# Patient Record
Sex: Female | Born: 1960 | Race: White | Hispanic: No | Smoking: Never smoker
Health system: Southern US, Community
[De-identification: ages and names within clinical notes are randomized; demographics above are authoritative.]

## PROBLEM LIST (undated history)

## (undated) DIAGNOSIS — J45909 Unspecified asthma, uncomplicated: Secondary | ICD-10-CM

## (undated) DIAGNOSIS — M1990 Unspecified osteoarthritis, unspecified site: Secondary | ICD-10-CM

## (undated) DIAGNOSIS — I10 Essential (primary) hypertension: Secondary | ICD-10-CM

## (undated) DIAGNOSIS — E785 Hyperlipidemia, unspecified: Secondary | ICD-10-CM

## (undated) DIAGNOSIS — M810 Age-related osteoporosis without current pathological fracture: Secondary | ICD-10-CM

## (undated) HISTORY — DX: Hyperlipidemia, unspecified: E78.5

## (undated) HISTORY — DX: Unspecified osteoarthritis, unspecified site: M19.90

## (undated) HISTORY — DX: Age-related osteoporosis without current pathological fracture: M81.0

## (undated) HISTORY — PX: BREAST BIOPSY: SHX20

## (undated) HISTORY — PX: BREAST EXCISIONAL BIOPSY: SUR124

## (undated) HISTORY — DX: Unspecified asthma, uncomplicated: J45.909

## (undated) HISTORY — DX: Essential (primary) hypertension: I10

---

## 1981-04-29 HISTORY — PX: KNEE SURGERY: SHX244

## 1999-04-30 HISTORY — PX: TOTAL ABDOMINAL HYSTERECTOMY: SHX209

## 2007-12-29 ENCOUNTER — Ambulatory Visit: Payer: Self-pay | Admitting: Internal Medicine

## 2008-01-22 ENCOUNTER — Ambulatory Visit: Payer: Self-pay | Admitting: Oncology

## 2008-01-28 ENCOUNTER — Ambulatory Visit: Payer: Self-pay | Admitting: Oncology

## 2008-02-28 ENCOUNTER — Ambulatory Visit: Payer: Self-pay | Admitting: Oncology

## 2008-03-29 ENCOUNTER — Ambulatory Visit: Payer: Self-pay | Admitting: Oncology

## 2008-04-29 ENCOUNTER — Ambulatory Visit: Payer: Self-pay | Admitting: Oncology

## 2008-05-30 ENCOUNTER — Ambulatory Visit: Payer: Self-pay | Admitting: Oncology

## 2008-06-27 ENCOUNTER — Ambulatory Visit: Payer: Self-pay | Admitting: Oncology

## 2008-07-28 ENCOUNTER — Ambulatory Visit: Payer: Self-pay

## 2008-08-01 ENCOUNTER — Ambulatory Visit: Payer: Self-pay | Admitting: Oncology

## 2008-08-27 ENCOUNTER — Ambulatory Visit: Payer: Self-pay

## 2008-08-27 ENCOUNTER — Ambulatory Visit: Payer: Self-pay | Admitting: Oncology

## 2008-09-27 ENCOUNTER — Ambulatory Visit: Payer: Self-pay | Admitting: Oncology

## 2008-10-27 ENCOUNTER — Ambulatory Visit: Payer: Self-pay | Admitting: Oncology

## 2008-12-12 ENCOUNTER — Ambulatory Visit: Payer: Self-pay | Admitting: Oncology

## 2008-12-28 ENCOUNTER — Ambulatory Visit: Payer: Self-pay | Admitting: Oncology

## 2009-01-27 ENCOUNTER — Ambulatory Visit: Payer: Self-pay | Admitting: Oncology

## 2009-02-20 ENCOUNTER — Ambulatory Visit: Payer: Self-pay

## 2009-02-27 ENCOUNTER — Ambulatory Visit: Payer: Self-pay | Admitting: Oncology

## 2009-03-29 ENCOUNTER — Ambulatory Visit: Payer: Self-pay | Admitting: Oncology

## 2009-04-29 HISTORY — PX: MENISCECTOMY: SHX123

## 2009-05-15 ENCOUNTER — Ambulatory Visit: Payer: Self-pay | Admitting: Internal Medicine

## 2009-05-30 ENCOUNTER — Ambulatory Visit: Payer: Self-pay | Admitting: Internal Medicine

## 2009-06-27 ENCOUNTER — Ambulatory Visit: Payer: Self-pay | Admitting: Internal Medicine

## 2009-07-28 ENCOUNTER — Ambulatory Visit: Payer: Self-pay | Admitting: Internal Medicine

## 2009-08-27 ENCOUNTER — Ambulatory Visit: Payer: Self-pay | Admitting: Internal Medicine

## 2009-09-27 ENCOUNTER — Ambulatory Visit: Payer: Self-pay | Admitting: Internal Medicine

## 2009-10-27 ENCOUNTER — Ambulatory Visit: Payer: Self-pay | Admitting: Oncology

## 2009-11-03 ENCOUNTER — Ambulatory Visit: Payer: Self-pay | Admitting: Oncology

## 2009-11-27 ENCOUNTER — Ambulatory Visit: Payer: Self-pay | Admitting: Oncology

## 2009-11-27 ENCOUNTER — Ambulatory Visit: Payer: Self-pay | Admitting: Internal Medicine

## 2009-12-28 ENCOUNTER — Ambulatory Visit: Payer: Self-pay | Admitting: Oncology

## 2010-01-27 ENCOUNTER — Ambulatory Visit: Payer: Self-pay | Admitting: Internal Medicine

## 2010-01-27 ENCOUNTER — Ambulatory Visit: Payer: Self-pay | Admitting: Oncology

## 2010-02-27 ENCOUNTER — Ambulatory Visit: Payer: Self-pay | Admitting: Oncology

## 2010-02-27 ENCOUNTER — Ambulatory Visit: Payer: Self-pay | Admitting: Internal Medicine

## 2010-03-29 ENCOUNTER — Ambulatory Visit: Payer: Self-pay | Admitting: Oncology

## 2010-04-29 ENCOUNTER — Ambulatory Visit: Payer: Self-pay | Admitting: Oncology

## 2010-05-30 ENCOUNTER — Ambulatory Visit: Payer: Self-pay | Admitting: Oncology

## 2010-06-28 ENCOUNTER — Ambulatory Visit: Payer: Self-pay | Admitting: Oncology

## 2010-07-29 ENCOUNTER — Ambulatory Visit: Payer: Self-pay | Admitting: Oncology

## 2010-08-28 ENCOUNTER — Ambulatory Visit: Payer: Self-pay | Admitting: Oncology

## 2010-09-28 ENCOUNTER — Ambulatory Visit: Payer: Self-pay | Admitting: Oncology

## 2010-10-28 ENCOUNTER — Ambulatory Visit: Payer: Self-pay | Admitting: Oncology

## 2010-11-30 ENCOUNTER — Ambulatory Visit: Payer: Self-pay | Admitting: Oncology

## 2010-12-29 ENCOUNTER — Ambulatory Visit: Payer: Self-pay | Admitting: Oncology

## 2011-01-28 ENCOUNTER — Ambulatory Visit: Payer: Self-pay | Admitting: Oncology

## 2011-02-28 ENCOUNTER — Ambulatory Visit: Payer: Self-pay | Admitting: Oncology

## 2011-04-01 ENCOUNTER — Ambulatory Visit: Payer: Self-pay | Admitting: Oncology

## 2011-04-30 ENCOUNTER — Ambulatory Visit: Payer: Self-pay | Admitting: Oncology

## 2011-05-31 ENCOUNTER — Ambulatory Visit: Payer: Self-pay | Admitting: Oncology

## 2011-06-28 ENCOUNTER — Ambulatory Visit: Payer: Self-pay | Admitting: Oncology

## 2011-07-29 ENCOUNTER — Ambulatory Visit: Payer: Self-pay | Admitting: Oncology

## 2011-08-28 ENCOUNTER — Ambulatory Visit: Payer: Self-pay | Admitting: Oncology

## 2011-09-28 ENCOUNTER — Ambulatory Visit: Payer: Self-pay | Admitting: Oncology

## 2011-10-28 ENCOUNTER — Ambulatory Visit: Payer: Self-pay | Admitting: Oncology

## 2011-11-28 ENCOUNTER — Ambulatory Visit: Payer: Self-pay | Admitting: Oncology

## 2011-12-29 ENCOUNTER — Ambulatory Visit: Payer: Self-pay | Admitting: Oncology

## 2012-01-28 ENCOUNTER — Ambulatory Visit: Payer: Self-pay | Admitting: Oncology

## 2012-02-28 ENCOUNTER — Ambulatory Visit: Payer: Self-pay | Admitting: Oncology

## 2016-11-29 LAB — HM MAMMOGRAPHY

## 2019-04-29 LAB — HM MAMMOGRAPHY

## 2020-06-23 LAB — HM COLONOSCOPY

## 2020-12-15 ENCOUNTER — Other Ambulatory Visit: Payer: Self-pay

## 2020-12-15 ENCOUNTER — Ambulatory Visit: Payer: BC Managed Care – PPO | Admitting: Family Medicine

## 2020-12-15 ENCOUNTER — Encounter: Payer: Self-pay | Admitting: Family Medicine

## 2020-12-15 VITALS — BP 136/80 | HR 72 | Ht 62.0 in | Wt 109.6 lb

## 2020-12-15 DIAGNOSIS — L4050 Arthropathic psoriasis, unspecified: Secondary | ICD-10-CM

## 2020-12-15 DIAGNOSIS — E782 Mixed hyperlipidemia: Secondary | ICD-10-CM | POA: Insufficient documentation

## 2020-12-15 DIAGNOSIS — M810 Age-related osteoporosis without current pathological fracture: Secondary | ICD-10-CM | POA: Insufficient documentation

## 2020-12-15 DIAGNOSIS — Z1231 Encounter for screening mammogram for malignant neoplasm of breast: Secondary | ICD-10-CM | POA: Diagnosis not present

## 2020-12-15 DIAGNOSIS — Z23 Encounter for immunization: Secondary | ICD-10-CM

## 2020-12-15 DIAGNOSIS — G629 Polyneuropathy, unspecified: Secondary | ICD-10-CM

## 2020-12-15 DIAGNOSIS — B001 Herpesviral vesicular dermatitis: Secondary | ICD-10-CM | POA: Insufficient documentation

## 2020-12-15 DIAGNOSIS — Z803 Family history of malignant neoplasm of breast: Secondary | ICD-10-CM | POA: Diagnosis not present

## 2020-12-15 MED ORDER — GABAPENTIN 300 MG PO CAPS: 300.0000 mg | ORAL_CAPSULE | Freq: Three times a day (TID) | ORAL | 3 refills | Status: DC

## 2020-12-15 MED ORDER — VALACYCLOVIR HCL 500 MG PO TABS: 500.0000 mg | ORAL_TABLET | Freq: Every day | ORAL | 3 refills | Status: DC

## 2020-12-15 MED ORDER — ROSUVASTATIN CALCIUM 10 MG PO TABS: 10.0000 mg | ORAL_TABLET | ORAL | 3 refills | Status: DC

## 2020-12-15 NOTE — Progress Notes (Signed)
Subjective:    Patient ID: Jane SermonsCynthia Chimento, female    DOB: 1960/07/05, 60 y.o.   MRN: [id number]  Jane SermonsCynthia Birdwell is a 60 y.o. female presenting on 12/15/2020 for Establish Care  Moved from [place] to [place] recently, mother passed in 03/2020, and she has 3 sons nearby in [place], [place], [place]. 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  HPI  Psoriatic Arthritis Osteoporosis Followed by Rheumatology - Dr Lendon ColonelHawks [facility] Previous on Remicade then no longer effective, and she took Mauritaniatezla she had severe GI side effects. Taking Cosyntx x 2 inj once monthly and Prolia now Also with psoriatic plaques occasionally rarely - resolves quickly.  Neuropathy, LLE History of back pain History of low back injury with heavy lifting accident in 2016 - out of work, PT. Improved with activity On Gabapentin 300mg  AM / evening / bedtime She had prior injections from Neurologist. They have since retired.  History of prior Knee Surgeries  CHRONIC HTN: Reports unsure accuracy of BP Current Meds - Losartan 50mg  daily, Metoprolol XL 25mg  daily   Reports good compliance, took meds today. Tolerating well, w/o complaints. Denies CP, dyspnea, HA, edema, dizziness / lightheadedness  HYPERLIPIDEMIA: - Reports no concerns. Last lipid panel 08/2020 - Currently taking Rosuvastatin 10mg , tolerating well without side effects or myalgias Fam hx  Allergy induced Asthma Scents and smells perfumes trigger. On ALbuterol PRN  Health Maintenance:  Due Flu Shot in 2022  Total Hysterectomy 2001 s/p, without cervix. No pap smear. - On Estradiol 0.5mg  daily for hormone replacement. Currently.  Mammogram last done 03/2019  COVID19 vaccine. Updated will get copy of card  Last Colonoscopy 06/23/20 ([facility]) results with x 3 polyps, repeat in 3 years.  [facility] 12/15/2020  Decreased Interest 0  Down, Depressed, Hopeless 0  PHQ - 2 Score 0  Altered sleeping 0   Tired, decreased energy 0  Change in appetite 0  Feeling bad or failure about yourself  0  Trouble concentrating 0  Moving slowly or fidgety/restless 0  Suicidal thoughts 0  PHQ-9 Score 0  Difficult doing work/chores Not difficult at all    Past Medical History:  Diagnosis Date   Arthritis    Asthma    Hyperlipidemia    Hypertension    Osteoporosis    Past Surgical History:  Procedure Laterality Date   CESAREAN SECTION  1986   1989, 1991   KNEE SURGERY Left 1983   MENISCECTOMY Right 2011   TOTAL ABDOMINAL HYSTERECTOMY  2001   removal of cervix as well   Social History   Socioeconomic History   Marital status: Divorced    Spouse name: Not on file   Number of children: Not on file   Years of education: Not on file   Highest education level: Not on file  Occupational History   Not on file  Tobacco Use   Smoking status: Never   Smokeless tobacco: Never  Substance and Sexual Activity   Alcohol use: Yes    Alcohol/week: 2.0 standard drinks    Types: 2 Glasses of wine per week   Drug use: Not on file   Sexual activity: Not on file  Other Topics Concern   Not on file  Social History Narrative   Not on file   Social Determinants of Health   Financial Resource Strain: Not on file  Food Insecurity: Not on file  Transportation Needs: Not on file  Physical Activity: Not on file  Stress: Not  on file  Social Connections: Not on file  Intimate Partner Violence: Not on file   Family History  Problem Relation Age of Onset   Breast cancer Mother    Bladder Cancer Mother    Current Outpatient Medications on File Prior to Visit  Medication Sig   albuterol (VENTOLIN HFA) 108 (90 Base) MCG/ACT inhaler Inhale into the lungs.   COSENTYX 150 MG/ML SOSY Inject 300 mg as directed every 30 (thirty) days. Per Rheumatology   denosumab (PROLIA) 60 MG/ML SOSY injection Inject into the skin.   estradiol (ESTRACE) 0.5 MG tablet Take 0.5 mg by mouth daily.   losartan (COZAAR) 50  MG tablet Take 50 mg by mouth daily.   metoprolol succinate (TOPROL-XL) 25 MG 24 hr tablet Take 25 mg by mouth daily.   No current facility-administered medications on file prior to visit.    Review of Systems Per HPI unless specifically indicated above      Objective:    BP 136/80 (BP Location: Left Arm, Cuff Size: Normal)   Pulse 72   Ht 5\' 2"  (1.575 m)   Wt 109 lb 9.6 oz (49.7 kg)   SpO2 100%   BMI 20.05 kg/m   Wt Readings from Last 3 Encounters:  12/15/20 109 lb 9.6 oz (49.7 kg)    Physical Exam Vitals and nursing note reviewed.  Constitutional:      General: She is not in acute distress.    Appearance: Normal appearance. She is well-developed. She is not diaphoretic.     Comments: Well-appearing, comfortable, cooperative  HENT:     Head: Normocephalic and atraumatic.  Eyes:     General:        Right eye: No discharge.        Left eye: No discharge.     Conjunctiva/sclera: Conjunctivae normal.  Cardiovascular:     Rate and Rhythm: Normal rate.  Pulmonary:     Effort: Pulmonary effort is normal.  Skin:    General: Skin is warm and dry.     Findings: No erythema or rash.  Neurological:     Mental Status: She is alert and oriented to person, place, and time.  Psychiatric:        Mood and Affect: Mood normal.        Behavior: Behavior normal.        Thought Content: Thought content normal.     Comments: Well groomed, good eye contact, normal speech and thoughts    MG MAMMOGRAM DIAGNOSTIC LEFT W TOMO  Anatomical Region Laterality Modality  Breast left Mammography   Narrative  CLINICAL DATA:  Screening recall for possible asymmetries in the  left breast.   EXAM:  DIGITAL DIAGNOSTIC LEFT MAMMOGRAM WITH CAD AND TOMO   ULTRASOUND LEFT BREAST   COMPARISON:  Previous exam(s).   ACR Breast Density Category c: The breast tissue is heterogeneously  dense, which may obscure small masses.   FINDINGS:  The asymmetry noted in the inferior left breast partly  disperses on  spot compression imaging. There is a small residual round to oval  mass in the lower inner quadrant that accounted for part of the  screening mammographic asymmetry.   The asymmetry in the upper outer left breast partly disperses on  spot compression imaging. The residual asymmetry has mixed  attenuation and ill-defined margins consistent with normal  fibroglandular tissue. There is no corresponding abnormality on the  cc view, and this asymmetry disperses to a greater degree on the mL  view.   Mammographic images were processed with CAD.   On physical exam, no mass is palpated in the upper outer left  breast.   Targeted ultrasound is performed, showing a small round, 5 x 4 x 4  mm cyst in the left breast at 7 o'clock, 1 cm from the nipple,  anterior depth, corresponding to the small persistent mass seen in  this location mammographically. In the upper outer quadrant there  are several small cysts measuring 6-9 mm in long axis, which may  account for a component of the asymmetry seen mammographically.  There are no solid masses or suspicious lesions.   IMPRESSION:  1. No evidence of breast malignancy.  2. Benign left breast cysts.   RECOMMENDATION:  Screening mammogram in one year.(Code:SM-B-01Y)   I have discussed the findings and recommendations with the patient.  If applicable, a reminder letter will be sent to the patient  regarding the next appointment.   BI-RADS CATEGORY  2: Benign.    Electronically Signed    By: Amie Portland M.D.    On: 04/29/2019 11:17  Results for orders placed or performed in visit on 12/15/20  HM MAMMOGRAPHY  Result Value Ref Range   HM Mammogram 0-4 Bi-Rad 0-4 Bi-Rad, Self Reported Normal  HM COLONOSCOPY  Result Value Ref Range   HM Colonoscopy See Report (in chart) See Report (in chart), Patient Reported      Assessment & Plan:   Problem List Items Addressed This Visit     Psoriatic arthritis (HCC) - Primary    Relevant Medications   COSENTYX 150 MG/ML SOSY   Osteoporosis without current pathological fracture   Relevant Medications   denosumab (PROLIA) 60 MG/ML SOSY injection   Neuropathy   Relevant Medications   gabapentin (NEURONTIN) 300 MG capsule   Mixed hyperlipidemia   Relevant Medications   losartan (COZAAR) 50 MG tablet   metoprolol succinate (TOPROL-XL) 25 MG 24 hr tablet   rosuvastatin (CRESTOR) 10 MG tablet   Cold sore   Relevant Medications   valACYclovir (VALTREX) 500 MG tablet   Other Visit Diagnoses     Family history of breast cancer       Relevant Orders   MM DIGITAL SCREENING BILATERAL   Encounter for screening mammogram for malignant neoplasm of breast       Relevant Orders   MM DIGITAL SCREENING BILATERAL   Need for shingles vaccine       Relevant Orders   Varicella-zoster vaccine IM (Completed)       Mammogram ordered for [facility] She will call to schedule  Cold sores recurrent Order Valtrex 500mg  daily suppression in future can come off and use PRN  Shingrix today #1 then repeat 3 months  HTN Controlled on repeat check Will re-eval home BP readings and outside records May be able to DC Metoprolol  HLD Continue Statin therapy x 3 weekly, will review labs  Psoriatic arthritis Osteoporosis Co / managed by Dr June - Rheumatology in [place] On Cosentyx 150 mg inj x 2 - 30 days On Prolia  Orders Placed This Encounter  Procedures   HM MAMMOGRAPHY    This external order was created through the Results Console.   MM DIGITAL SCREENING BILATERAL    Standing Status:   Future    Standing Expiration Date:   12/15/2021    Order Specific Question:   Reason for Exam (SYMPTOM  OR DIAGNOSIS REQUIRED)    Answer:   Routine screening bilateral mammogram. May  change to Bilateral MM Tomo screening if indicated and appropriate for patient/insurance    Order Specific Question:   Preferred imaging location?    Answer:   [facility]   Varicella-zoster vaccine  IM   HM COLONOSCOPY    This external order was created through the Results Console.     Meds ordered this encounter  Medications   valACYclovir (VALTREX) 500 MG tablet    Sig: Take 1 tablet (500 mg total) by mouth daily. For suppression    Dispense:  90 tablet    Refill:  3   gabapentin (NEURONTIN) 300 MG capsule    Sig: Take 1 capsule (300 mg total) by mouth 3 (three) times daily.    Dispense:  270 capsule    Refill:  3   rosuvastatin (CRESTOR) 10 MG tablet    Sig: Take 1 tablet (10 mg total) by mouth 3 (three) times a week.    Dispense:  36 tablet    Refill:  3      Follow up plan: Return in about 3 months (around 03/17/2021) for 3 month Follow-up HTN, med review, rheum / Shingrix #2.  Saralyn Pilar, DO [facility] 12/15/2020, 10:13 AM

## 2020-12-15 NOTE — Patient Instructions (Addendum)
Thank you for coming to the office today.  For Mammogram screening for breast cancer   Call the Imaging Center below anytime to schedule your own appointment now that order has been placed.  [facility] [address] [place], [place] [address] Phone: [phone]  -------------------------------------------------------  Shingrix Vaccine today repeat dose in 3 months to complete series  ----  Check BP to monitor this going forward few days a week, and we can keep track of it.  We may be able to get rid of the Metoprolol XL   Please schedule a Follow-up Appointment to: Return in about 3 months (around 03/17/2021) for 3 month Follow-up HTN, med review, rheum / Shingrix #2.  If you have any other questions or concerns, please feel free to call the office or send a message through MyChart. You may also schedule an earlier appointment if necessary.  Additionally, you may be receiving a survey about your experience at our office within a few days to 1 week by e-mail or mail. We value your feedback.  Saralyn Pilar, DO [facility], [place]

## 2020-12-18 ENCOUNTER — Encounter: Payer: Self-pay | Admitting: Family Medicine

## 2020-12-29 ENCOUNTER — Encounter: Payer: Self-pay | Admitting: Family Medicine

## 2021-01-12 ENCOUNTER — Other Ambulatory Visit: Payer: Self-pay

## 2021-01-12 ENCOUNTER — Other Ambulatory Visit: Payer: Self-pay | Admitting: Family Medicine

## 2021-01-12 ENCOUNTER — Ambulatory Visit (INDEPENDENT_AMBULATORY_CARE_PROVIDER_SITE_OTHER): Payer: BC Managed Care – PPO

## 2021-01-12 DIAGNOSIS — Z23 Encounter for immunization: Secondary | ICD-10-CM

## 2021-01-12 DIAGNOSIS — Z1231 Encounter for screening mammogram for malignant neoplasm of breast: Secondary | ICD-10-CM

## 2021-01-12 DIAGNOSIS — Z803 Family history of malignant neoplasm of breast: Secondary | ICD-10-CM

## 2021-01-31 ENCOUNTER — Ambulatory Visit
Admission: RE | Admit: 2021-01-31 | Discharge: 2021-01-31 | Disposition: A | Discharge status: Home / Self Care | Payer: BC Managed Care – PPO | Source: Ambulatory Visit | Attending: Family Medicine | Admitting: Family Medicine

## 2021-01-31 ENCOUNTER — Other Ambulatory Visit: Payer: Self-pay

## 2021-01-31 DIAGNOSIS — Z1231 Encounter for screening mammogram for malignant neoplasm of breast: Secondary | ICD-10-CM | POA: Diagnosis not present

## 2021-01-31 DIAGNOSIS — Z803 Family history of malignant neoplasm of breast: Secondary | ICD-10-CM | POA: Insufficient documentation

## 2021-02-05 ENCOUNTER — Inpatient Hospital Stay
Admission: RE | Admit: 2021-02-05 | Discharge: 2021-02-05 | Disposition: A | Discharge status: Home / Self Care | Payer: Self-pay | Source: Ambulatory Visit | Attending: *Deleted | Admitting: *Deleted

## 2021-02-05 ENCOUNTER — Other Ambulatory Visit: Payer: Self-pay | Admitting: Family Medicine

## 2021-02-05 ENCOUNTER — Other Ambulatory Visit: Payer: Self-pay | Admitting: *Deleted

## 2021-02-05 DIAGNOSIS — Z1231 Encounter for screening mammogram for malignant neoplasm of breast: Secondary | ICD-10-CM

## 2021-02-05 DIAGNOSIS — N6320 Unspecified lump in the left breast, unspecified quadrant: Secondary | ICD-10-CM

## 2021-02-05 DIAGNOSIS — R928 Other abnormal and inconclusive findings on diagnostic imaging of breast: Secondary | ICD-10-CM

## 2021-02-16 ENCOUNTER — Ambulatory Visit
Admission: RE | Admit: 2021-02-16 | Discharge: 2021-02-16 | Disposition: A | Discharge status: Home / Self Care | Payer: BC Managed Care – PPO | Source: Ambulatory Visit | Attending: Family Medicine | Admitting: Family Medicine

## 2021-02-16 ENCOUNTER — Other Ambulatory Visit: Payer: Self-pay

## 2021-02-16 DIAGNOSIS — R928 Other abnormal and inconclusive findings on diagnostic imaging of breast: Secondary | ICD-10-CM | POA: Insufficient documentation

## 2021-02-16 DIAGNOSIS — N6320 Unspecified lump in the left breast, unspecified quadrant: Secondary | ICD-10-CM

## 2021-03-18 ENCOUNTER — Encounter: Payer: Self-pay | Admitting: Family Medicine

## 2021-03-19 ENCOUNTER — Other Ambulatory Visit: Payer: Self-pay

## 2021-03-19 ENCOUNTER — Ambulatory Visit: Payer: BC Managed Care – PPO | Admitting: Family Medicine

## 2021-03-19 ENCOUNTER — Encounter: Payer: Self-pay | Admitting: Family Medicine

## 2021-03-19 VITALS — BP 144/84 | HR 65 | Ht 62.0 in | Wt 116.2 lb

## 2021-03-19 DIAGNOSIS — I10 Essential (primary) hypertension: Secondary | ICD-10-CM | POA: Diagnosis not present

## 2021-03-19 DIAGNOSIS — J4521 Mild intermittent asthma with (acute) exacerbation: Secondary | ICD-10-CM | POA: Diagnosis not present

## 2021-03-19 MED ORDER — ALBUTEROL SULFATE (2.5 MG/3ML) 0.083% IN NEBU: 2.5000 mg | INHALATION_SOLUTION | Freq: Four times a day (QID) | RESPIRATORY_TRACT | 1 refills | Status: DC | PRN

## 2021-03-19 MED ORDER — ALBUTEROL SULFATE HFA 108 (90 BASE) MCG/ACT IN AERS: 1.0000 | INHALATION_SPRAY | RESPIRATORY_TRACT | 3 refills | Status: DC | PRN

## 2021-03-19 MED ORDER — PREDNISONE 20 MG PO TABS: ORAL_TABLET | ORAL | 0 refills | Status: DC

## 2021-03-19 NOTE — Patient Instructions (Addendum)
Thank you for coming to the office today.  Re-schedule Shingrix (shingles) #2, within 6 months of original 1st dose (11/2020)  Bring BP cuff next time, keep checking readings. I think your cuff is fairly accurate.  Mild elevated today. No change in medicine.  For asthma, ordered albuterol inhaler + solution should be 50 packs total.  Steroid prednisone taper 7 day  Please schedule a Follow-up Appointment to: Return in about 3 months (around 06/19/2021) for 3 month HTN BP check (bring own cuff).  If you have any other questions or concerns, please feel free to call the office or send a message through MyChart. You may also schedule an earlier appointment if necessary.  Additionally, you may be receiving a survey about your experience at our office within a few days to 1 week by e-mail or mail. We value your feedback.  Saralyn Pilar, DO [facility], [place]

## 2021-03-19 NOTE — Progress Notes (Signed)
Subjective:    Patient ID: Jane Sanchez, female    DOB: 04-Aug-1960, 60 y.o.   MRN: [id number]  Jane Sanchez is a 60 y.o. female presenting on 03/19/2021 for Hypertension   HPI  CHRONIC HTN: Reports unsure accuracy of BP has home readings. Current Meds - Losartan 50mg  daily, Metoprolol XL 25mg  daily   Reports good compliance, took meds today. Tolerating well, w/o complaints. Denies CP, dyspnea, HA, edema, dizziness / lightheadedness   HYPERLIPIDEMIA: - Reports no concerns. Last lipid panel 08/2020 - Currently taking Rosuvastatin 10mg , tolerating well without side effects or myalgias Fam hx   Allergy induced Asthma Scents and smells perfumes trigger. Lately has had flare up of asthma. With coughing wheezing flare up. Needs re order on medicine. On Albuterol PRN  Admits asthma flare up past 1 week, tightness in chest She has tested negative for covid19 on home test for past 4 days.    Depression screen [facility] 2/9 12/15/2020  Decreased Interest 0  Down, Depressed, Hopeless 0  PHQ - 2 Score 0  Altered sleeping 0  Tired, decreased energy 0  Change in appetite 0  Feeling bad or failure about yourself  0  Trouble concentrating 0  Moving slowly or fidgety/restless 0  Suicidal thoughts 0  PHQ-9 Score 0  Difficult doing work/chores Not difficult at all    Social History   Tobacco Use   Smoking status: Never   Smokeless tobacco: Never  Substance Use Topics   Alcohol use: Yes    Alcohol/week: 2.0 standard drinks    Types: 2 Glasses of wine per week    Review of Systems Per HPI unless specifically indicated above     Objective:    BP (!) 144/84 (BP Location: Left Arm, Cuff Size: Normal)   Pulse 65   Ht 5\' 2"  (1.575 m)   Wt 116 lb 3.2 oz (52.7 kg)   SpO2 100%   BMI 21.25 kg/m   Wt Readings from Last 3 Encounters:  03/19/21 116 lb 3.2 oz (52.7 kg)  12/15/20 109 lb 9.6 oz (49.7 kg)    Physical Exam Vitals and nursing note reviewed.  Constitutional:       General: She is not in acute distress.    Appearance: She is well-developed. She is not diaphoretic.     Comments: Well-appearing, comfortable, cooperative  HENT:     Head: Normocephalic and atraumatic.  Eyes:     General:        Right eye: No discharge.        Left eye: No discharge.     Conjunctiva/sclera: Conjunctivae normal.  Neck:     Thyroid: No thyromegaly.  Cardiovascular:     Rate and Rhythm: Normal rate and regular rhythm.     Heart sounds: Normal heart sounds. No murmur heard. Pulmonary:     Effort: Pulmonary effort is normal. No respiratory distress.     Breath sounds: Wheezing present. No rales.  Musculoskeletal:        General: Normal range of motion.     Cervical back: Normal range of motion and neck supple.  Lymphadenopathy:     Cervical: No cervical adenopathy.  Skin:    General: Skin is warm and dry.     Findings: No erythema or rash.  Neurological:     Mental Status: She is alert and oriented to person, place, and time.  Psychiatric:        Behavior: Behavior normal.     Comments: Well groomed, good  eye contact, normal speech and thoughts   Results for orders placed or performed in visit on 12/29/20  HM MAMMOGRAPHY  Result Value Ref Range   HM Mammogram 0-4 Bi-Rad 0-4 Bi-Rad, Self Reported Normal      Assessment & Plan:   Problem List Items Addressed This Visit     Essential hypertension   Other Visit Diagnoses     Mild intermittent asthma with exacerbation    -  Primary   Relevant Medications   predniSONE (DELTASONE) 20 MG tablet   albuterol (VENTOLIN HFA) 108 (90 Base) MCG/ACT inhaler   albuterol (PROVENTIL) (2.5 MG/3ML) 0.083% nebulizer solution       Mild elevated BP Home readings reviewed Will have her bring BP cuff to calibrate next time No change, keep on Losartan  Asthma exacerbation, mild intermittent allergies Re order albuterol inhaler + solution Start Prednisone taper 7 days   Meds ordered this encounter  Medications    predniSONE (DELTASONE) 20 MG tablet    Sig: Take daily with food. Start with 60mg  (3 pills) x 2 days, then reduce to 40mg  (2 pills) x 2 days, then 20mg  (1 pill) x 3 days    Dispense:  13 tablet    Refill:  0   albuterol (VENTOLIN HFA) 108 (90 Base) MCG/ACT inhaler    Sig: Inhale 1-2 puffs into the lungs every 4 (four) hours as needed for wheezing or shortness of breath.    Dispense:  8 g    Refill:  3   albuterol (PROVENTIL) (2.5 MG/3ML) 0.083% nebulizer solution    Sig: Take 3 mLs (2.5 mg total) by nebulization every 6 (six) hours as needed for wheezing or shortness of breath.    Dispense:  150 mL    Refill:  1    Smallest quantity available please.      Follow up plan: Return in about 3 months (around 06/19/2021) for 3 month HTN BP check (bring own cuff).   , DO [facility] 03/19/2021, 10:08 AM

## 2021-03-21 ENCOUNTER — Ambulatory Visit: Payer: Self-pay | Admitting: *Deleted

## 2021-03-21 DIAGNOSIS — J9801 Acute bronchospasm: Secondary | ICD-10-CM

## 2021-03-21 DIAGNOSIS — J4521 Mild intermittent asthma with (acute) exacerbation: Secondary | ICD-10-CM

## 2021-03-21 MED ORDER — BENZONATATE 100 MG PO CAPS: 100.0000 mg | ORAL_CAPSULE | Freq: Three times a day (TID) | ORAL | 0 refills | Status: DC | PRN

## 2021-03-21 MED ORDER — HYDROCODONE BIT-HOMATROP MBR 5-1.5 MG/5ML PO SOLN: 5.0000 mL | Freq: Four times a day (QID) | ORAL | 0 refills | Status: DC | PRN

## 2021-03-21 NOTE — Telephone Encounter (Signed)
Patient states she was seen is office 11/21 for asthma exacerbation. Patient reports she is taking her medication and treating her symptoms. Patient is requesting an additional medication to help with her cough- she states it is just not improving and it is keeping her up at night. Patient advised I would send request to provider for review.

## 2021-03-21 NOTE — Telephone Encounter (Signed)
Pt is calling she stated she is dealing with a worsening cough she was currently on predniSONE and albuterol.  Pt is requesting to know what otc medication she can take to help with cough.  Reason for Disposition  [1] Continuous (nonstop) coughing interferes with work or school AND [2] no improvement using cough treatment per Care Advice  Answer Assessment - Initial Assessment Questions 1. ONSET: "When did the cough begin?"      11/18- was seen 11/21 2. SEVERITY: "How bad is the cough today?"      Chronic- dry-hacking- keeps patient up at night 3. SPUTUM: "Describe the color of your sputum" (none, dry cough; clear, white, yellow, green)     No sputum 4. HEMOPTYSIS: "Are you coughing up any blood?" If so ask: "How much?" (flecks, streaks, tablespoons, etc.)     no 5. DIFFICULTY BREATHING: "Are you having difficulty breathing?" If Yes, ask: "How bad is it?" (e.g., mild, moderate, severe)    - MILD: No SOB at rest, mild SOB with walking, speaks normally in sentences, can lie down, no retractions, pulse < 100.    - MODERATE: SOB at rest, SOB with minimal exertion and prefers to sit, cannot lie down flat, speaks in phrases, mild retractions, audible wheezing, pulse 100-120.    - SEVERE: Very SOB at rest, speaks in single words, struggling to breathe, sitting hunched forward, retractions, pulse > 120      Breathing is better- she is using her inhales and nebulizer- O2 sat normal 6. FEVER: "Do you have a fever?" If Yes, ask: "What is your temperature, how was it measured, and when did it start?"     no 7. CARDIAC HISTORY: "Do you have any history of heart disease?" (e.g., heart attack, congestive heart failure)      no 8. LUNG HISTORY: "Do you have any history of lung disease?"  (e.g., pulmonary embolus, asthma, emphysema)     asthma 9. PE RISK FACTORS: "Do you have a history of blood clots?" (or: recent major surgery, recent prolonged travel, bedridden)     no 10. OTHER SYMPTOMS: "Do you have  any other symptoms?" (e.g., runny nose, wheezing, chest pain)       Runny nose started today 11. PREGNANCY: "Is there any chance you are pregnant?" "When was your last menstrual period?"       na 12. TRAVEL: "Have you traveled out of the country in the last month?" (e.g., travel history, exposures)       no  Protocols used: Cough - Acute Non-Productive-A-AH

## 2021-03-21 NOTE — Addendum Note (Signed)
Addended by: Smitty Cords on: 03/21/2021 04:15 PM   Modules accepted: Orders

## 2021-03-21 NOTE — Telephone Encounter (Signed)
[place] patient. She is on Prednisone, albuterol still has asthma cough at night. Discussed options. Will sen tessalon perls PRN cough and hycodan cough syrup for night-time, reviewed risk on opiate controlled, she will proceed to try these and follow up if not improve  Saralyn Pilar, DO [facility] 03/21/2021, 4:14 PM

## 2021-04-06 ENCOUNTER — Encounter: Payer: Self-pay | Admitting: Family Medicine

## 2021-04-06 DIAGNOSIS — E8940 Asymptomatic postprocedural ovarian failure: Secondary | ICD-10-CM

## 2021-04-06 MED ORDER — ESTRADIOL 0.5 MG PO TABS: 0.5000 mg | ORAL_TABLET | Freq: Every day | ORAL | 1 refills | Status: DC

## 2021-05-01 ENCOUNTER — Other Ambulatory Visit: Payer: Self-pay

## 2021-05-01 ENCOUNTER — Encounter: Payer: Self-pay | Admitting: Family Medicine

## 2021-05-01 MED ORDER — LOSARTAN POTASSIUM 50 MG PO TABS: 50.0000 mg | ORAL_TABLET | Freq: Every day | ORAL | 3 refills | Status: DC

## 2021-05-11 ENCOUNTER — Other Ambulatory Visit: Payer: Self-pay

## 2021-05-11 ENCOUNTER — Ambulatory Visit (INDEPENDENT_AMBULATORY_CARE_PROVIDER_SITE_OTHER): Payer: BC Managed Care – PPO

## 2021-05-11 VITALS — Wt 116.0 lb

## 2021-05-11 DIAGNOSIS — Z23 Encounter for immunization: Secondary | ICD-10-CM | POA: Diagnosis not present

## 2021-05-18 LAB — HEPATIC FUNCTION PANEL
ALT: 8 (ref 7–35)
AST: 12 — AB (ref 13–35)
Alkaline Phosphatase: 68 (ref 25–125)
Bilirubin, Total: 0.3

## 2021-05-18 LAB — CBC AND DIFFERENTIAL
HCT: 37 (ref 36–46)
Hemoglobin: 12.9 (ref 12.0–16.0)
Neutrophils Absolute: 5.7
Platelets: 276 (ref 150–399)
WBC: 8.2

## 2021-05-18 LAB — BASIC METABOLIC PANEL
BUN: 7 (ref 4–21)
CO2: 21 (ref 13–22)
Chloride: 91 — AB (ref 99–108)
Creatinine: 0.7 (ref 0.5–1.1)
Glucose: 89
Potassium: 4.3 (ref 3.4–5.3)
Sodium: 128 — AB (ref 137–147)

## 2021-05-18 LAB — COMPREHENSIVE METABOLIC PANEL
Albumin: 4.4 (ref 3.5–5.0)
Calcium: 9.3 (ref 8.7–10.7)
GFR calc non Af Amer: 98
Globulin: 2.7

## 2021-05-18 LAB — CBC: RBC: 3.76 — AB (ref 3.87–5.11)

## 2021-05-23 ENCOUNTER — Encounter: Payer: Self-pay | Admitting: Family Medicine

## 2021-06-19 ENCOUNTER — Ambulatory Visit: Payer: BC Managed Care – PPO | Admitting: Family Medicine

## 2021-06-19 ENCOUNTER — Encounter: Payer: Self-pay | Admitting: Family Medicine

## 2021-06-19 ENCOUNTER — Other Ambulatory Visit: Payer: Self-pay

## 2021-06-19 ENCOUNTER — Other Ambulatory Visit: Payer: Self-pay | Admitting: Family Medicine

## 2021-06-19 VITALS — BP 116/85 | HR 80 | Ht 62.0 in | Wt 115.2 lb

## 2021-06-19 DIAGNOSIS — Z0000 Encounter for general adult medical examination without abnormal findings: Secondary | ICD-10-CM

## 2021-06-19 DIAGNOSIS — G8929 Other chronic pain: Secondary | ICD-10-CM | POA: Insufficient documentation

## 2021-06-19 DIAGNOSIS — E559 Vitamin D deficiency, unspecified: Secondary | ICD-10-CM

## 2021-06-19 DIAGNOSIS — L4050 Arthropathic psoriasis, unspecified: Secondary | ICD-10-CM

## 2021-06-19 DIAGNOSIS — I10 Essential (primary) hypertension: Secondary | ICD-10-CM | POA: Diagnosis not present

## 2021-06-19 DIAGNOSIS — E782 Mixed hyperlipidemia: Secondary | ICD-10-CM

## 2021-06-19 DIAGNOSIS — M5442 Lumbago with sciatica, left side: Secondary | ICD-10-CM | POA: Diagnosis not present

## 2021-06-19 DIAGNOSIS — M810 Age-related osteoporosis without current pathological fracture: Secondary | ICD-10-CM

## 2021-06-19 MED ORDER — LOSARTAN POTASSIUM 50 MG PO TABS: 50.0000 mg | ORAL_TABLET | Freq: Every day | ORAL | 3 refills | Status: DC

## 2021-06-19 MED ORDER — CYCLOBENZAPRINE HCL 10 MG PO TABS
5.0000 mg | ORAL_TABLET | Freq: Two times a day (BID) | ORAL | 1 refills | Status: DC | PRN
Start: 2021-06-19 — End: 2021-12-20

## 2021-06-19 MED ORDER — METOPROLOL SUCCINATE ER 25 MG PO TB24: 25.0000 mg | ORAL_TABLET | Freq: Every day | ORAL | 3 refills | Status: DC

## 2021-06-19 MED ORDER — PREDNISONE 10 MG PO TABS: ORAL_TABLET | ORAL | 0 refills | Status: DC

## 2021-06-19 NOTE — Assessment & Plan Note (Signed)
Followed by Rheumatology Planning to switch from Cosyntex to [facility]

## 2021-06-19 NOTE — Patient Instructions (Addendum)
Thank you for coming to the office today.  For the sciatica  Start Cyclobenzapine (Flexeril) 10mg  tablets (muscle relaxant) - start with half (cut) to one whole pill at night for muscle relaxant - may make you sedated or sleepy (be careful driving or working on this) if tolerated you can take half to whole tab 2 to 3 times daily or every 8 hours as needed  ----------------------  [facility]  [address]  [place], [place]  [address]  Appointments: [phone]   DUE for FASTING BLOOD WORK (no food or drink after midnight before the lab appointment, only water or coffee without cream/sugar on the morning of)  SCHEDULE "Lab Only" visit in the morning at the clinic for lab draw in 6 MONTHS   - Make sure Lab Only appointment is at about 1 week before your next appointment, so that results will be available  For Lab Results, once available within 2-3 days of blood draw, you can can log in to MyChart online to view your results and a brief explanation. Also, we can discuss results at next follow-up visit.    Please schedule a Follow-up Appointment to: Return in about 6 months (around 12/17/2021) for 6 month fasting lab only then 1 week later Annual PHysical.  If you have any other questions or concerns, please feel free to call the office or send a message through [place]. You may also schedule an earlier appointment if necessary.  Additionally, you may be receiving a survey about your experience at our office within a few days to 1 week by e-mail or mail. We value your feedback.  Nobie Putnam, DO [place]

## 2021-06-19 NOTE — Assessment & Plan Note (Signed)
Elevated BP due to back pain  No known complications    Plan:  1. Continue current BP regimen Losartan 50mg  and Metoprolol XL 25mg  daily 2. Encourage improved lifestyle - low sodium diet, regular exercise 3. Continue monitor BP outside office, bring readings to next visit, if persistently >140/90 or new symptoms notify office sooner

## 2021-06-19 NOTE — Progress Notes (Signed)
Subjective:    Patient ID: Jane Sanchez, female    DOB: 1960-09-29, 61 y.o.   MRN: [id number]  Iver Abegg is a 61 y.o. female presenting on 06/19/2021 for Hypertension   HPI  CHRONIC HTN: Home BP readings 110-130 / 70-80 Current Meds - Losartan 50mg  daily, Metoprolol XL 25mg  daily  needs 90 day orders Reports good compliance, took meds today. Tolerating well, w/o complaints. Denies CP, dyspnea, HA, edema, dizziness / lightheadedness   HYPERLIPIDEMIA: - Reports no concerns. Last lipid panel 08/2020 - Currently taking Rosuvastatin 10mg , tolerating well without side effects or myalgias Fam hx   Allergy induced Asthma Scents and smells perfumes trigger. Improving control asthma On Albuterol PRN  Chronic Back Pain W Sciatica Left sided only Osteoarthritis lumbar spine For past 2 months with flare up with low back pain radiating into lower extremity with sciatica with certain movements. Sends shock pain down leg. Admits muscle spasm and cramps. Already taking Gabapentin 300mg  TID History of compression fracture x 3 in back. Back pain sciatica keeping her awake  Rheumatoid Arthritis / Osteoporosis Switching from Cosyntex to Cimzia now     Depression screen [facility] 2/9 06/19/2021 12/15/2020  Decreased Interest 0 0  Down, Depressed, Hopeless 0 0  PHQ - 2 Score 0 0  Altered sleeping 0 0  Tired, decreased energy 0 0  Change in appetite 0 0  Feeling bad or failure about yourself  0 0  Trouble concentrating 0 0  Moving slowly or fidgety/restless 0 0  Suicidal thoughts 0 0  PHQ-9 Score 0 0  Difficult doing work/chores Not difficult at all Not difficult at all    Social History   Tobacco Use   Smoking status: Never   Smokeless tobacco: Never  Substance Use Topics   Alcohol use: Yes    Alcohol/week: 2.0 standard drinks    Types: 2 Glasses of wine per week    Review of Systems Per HPI unless specifically indicated above     Objective:    BP 116/85    Pulse 80     Ht 5\' 2"  (1.575 m)    Wt 115 lb 3.2 oz (52.3 kg)    SpO2 100%    BMI 21.07 kg/m   Wt Readings from Last 3 Encounters:  06/19/21 115 lb 3.2 oz (52.3 kg)  05/11/21 116 lb (52.6 kg)  03/19/21 116 lb 3.2 oz (52.7 kg)    Physical Exam Vitals and nursing note reviewed.  Constitutional:      General: She is not in acute distress.    Appearance: Normal appearance. She is well-developed. She is not diaphoretic.     Comments: Well-appearing, comfortable, cooperative  HENT:     Head: Normocephalic and atraumatic.  Eyes:     General:        Right eye: No discharge.        Left eye: No discharge.     Conjunctiva/sclera: Conjunctivae normal.  Cardiovascular:     Rate and Rhythm: Normal rate.  Pulmonary:     Effort: Pulmonary effort is normal.  Skin:    General: Skin is warm and dry.     Findings: No erythema or rash.  Neurological:     Mental Status: She is alert and oriented to person, place, and time.  Psychiatric:        Mood and Affect: Mood normal.        Behavior: Behavior normal.        Thought Content: Thought content normal.  Comments: Well groomed, good eye contact, normal speech and thoughts      Results for orders placed or performed in visit on 05/23/21  CBC and differential  Result Value Ref Range   Hemoglobin 12.9 12.0 - 16.0   HCT 37 36 - 46   Neutrophils Absolute 5.70    Platelets 276 150 - 399   WBC 8.2   CBC  Result Value Ref Range   RBC 3.76 (A) 3.87 - [id number]  Basic metabolic panel  Result Value Ref Range   Glucose 89    BUN 7 4 - 21   CO2 21 13 - 22   Creatinine 0.7 0.5 - 1.1   Potassium 4.3 3.4 - 5.3   Sodium 128 (A) 137 - 147   Chloride 91 (A) 99 - 108  Comprehensive metabolic panel  Result Value Ref Range   Globulin 2.7    GFR calc non Af Amer 98    Calcium 9.3 8.7 - 10.7   Albumin 4.4 3.5 - 5.0  Hepatic function panel  Result Value Ref Range   Alkaline Phosphatase 68 25 - 125   ALT 8 7 - 35   AST 12 (A) 13 - 35   Bilirubin, Total 0.3        Assessment & Plan:   Problem List Items Addressed This Visit     Psoriatic arthritis ([place])    Followed by Rheumatology Planning to switch from Cosyntex to Cimzia      Relevant Medications   cyclobenzaprine (FLEXERIL) 10 MG tablet   predniSONE (DELTASONE) 10 MG tablet   Essential hypertension - Primary    Elevated BP due to back pain  No known complications    Plan:  1. Continue current BP regimen Losartan 50mg  and Metoprolol XL 25mg  daily 2. Encourage improved lifestyle - low sodium diet, regular exercise 3. Continue monitor BP outside office, bring readings to next visit, if persistently >140/90 or new symptoms notify office sooner      Relevant Medications   losartan (COZAAR) 50 MG tablet   metoprolol succinate (TOPROL-XL) 25 MG 24 hr tablet   Chronic left-sided low back pain with left-sided sciatica   Relevant Medications   cyclobenzaprine (FLEXERIL) 10 MG tablet   predniSONE (DELTASONE) 10 MG tablet   Other Relevant Orders   Ambulatory referral to Neurosurgery    reports chronic low back pain with osteoarthritis, and known initial injury 2016 heavy lifting with pain, but she has had episodic flares left low back sciatica now. She has RA and is being managed. Also history of prior compression fractures of spine and DDD. She has had injections previously. She is on Gabapentin already. Limited relief.   Will refer to [facility], she has had prior injections may benefit from further advanced imaging, guided therapy PT and or injection.  Discussed med management, can continue Gabapentin 300 TID may adjust in future if need Add Flexeril 5-10mg  BID PRN can help sleep as well, discussed sedation side effect    Orders Placed This Encounter  Procedures   Ambulatory referral to Neurosurgery    Referral Priority:   Routine    Referral Type:   Surgical    Referral Reason:   Specialty Services Required    Requested Specialty:   Neurosurgery    Number  of Visits Requested:   1     Meds ordered this encounter  Medications   losartan (COZAAR) 50 MG tablet    Sig: Take 1 tablet (50 mg  total) by mouth daily.    Dispense:  90 tablet    Refill:  3    Change to 90 day   metoprolol succinate (TOPROL-XL) 25 MG 24 hr tablet    Sig: Take 1 tablet (25 mg total) by mouth daily.    Dispense:  90 tablet    Refill:  3   cyclobenzaprine (FLEXERIL) 10 MG tablet    Sig: Take 0.5-1 tablets (5-10 mg total) by mouth 2 (two) times daily as needed for muscle spasms.    Dispense:  90 tablet    Refill:  1   predniSONE (DELTASONE) 10 MG tablet    Sig: Take 6 tabs with breakfast Day 1, 5 tabs Day 2, 4 tabs Day 3, 3 tabs Day 4, 2 tabs Day 5, 1 tab Day 6.    Dispense:  21 tablet    Refill:  0      Follow up plan: Return in about 6 months (around 12/17/2021) for 6 month fasting lab only then 1 week later Annual PHysical.  Future labs ordered for 11/22/21   Nobie Putnam, [facility] 06/19/2021, 10:45 AM

## 2021-07-12 ENCOUNTER — Other Ambulatory Visit: Payer: Self-pay | Admitting: Family Medicine

## 2021-07-12 NOTE — Telephone Encounter (Signed)
Refused Estrace 0.5 mg tablet because it's being requested too early.  On 04/06/2021 #90, 1 refill was given.   ?

## 2021-11-05 ENCOUNTER — Other Ambulatory Visit: Payer: Self-pay | Admitting: Family Medicine

## 2021-11-05 DIAGNOSIS — E8940 Asymptomatic postprocedural ovarian failure: Secondary | ICD-10-CM

## 2021-11-06 NOTE — Telephone Encounter (Signed)
Requested Prescriptions  Pending Prescriptions Disp Refills  . estradiol (ESTRACE) 0.5 MG tablet [Pharmacy Med Name: ESTRADIOL 0.5 MG TAB] 90 tablet 0    Sig: TAKE 1 TABLET BY MOUTH ONCE DAILY     OB/GYN:  Estrogens Passed - 11/05/2021 11:07 AM      Passed - Mammogram is up-to-date per Health Maintenance      Passed - Last BP in normal range    BP Readings from Last 1 Encounters:  06/19/21 116/85         Passed - Valid encounter within last 12 months    Recent Outpatient Visits          4 months ago Essential hypertension   [facility] Mackey, Netta Neat, DO   7 months ago Mild intermittent asthma with exacerbation   [facility] Smitty Cords, DO   10 months ago Psoriatic arthritis [facility])   [facility] Althea Charon, Netta Neat, DO      Future Appointments            In 3 weeks Althea Charon, Netta Neat, DO [facility], [facility]

## 2021-11-22 ENCOUNTER — Other Ambulatory Visit: Payer: BC Managed Care – PPO

## 2021-11-27 ENCOUNTER — Other Ambulatory Visit: Payer: Self-pay

## 2021-11-27 ENCOUNTER — Emergency Department: Payer: BC Managed Care – PPO

## 2021-11-27 ENCOUNTER — Ambulatory Visit: Payer: BC Managed Care – PPO | Admitting: Family Medicine

## 2021-11-27 DIAGNOSIS — L4050 Arthropathic psoriasis, unspecified: Secondary | ICD-10-CM | POA: Diagnosis present

## 2021-11-27 DIAGNOSIS — D696 Thrombocytopenia, unspecified: Secondary | ICD-10-CM | POA: Diagnosis present

## 2021-11-27 DIAGNOSIS — Z881 Allergy status to other antibiotic agents status: Secondary | ICD-10-CM

## 2021-11-27 DIAGNOSIS — A4151 Sepsis due to Escherichia coli [E. coli]: Principal | ICD-10-CM | POA: Diagnosis present

## 2021-11-27 DIAGNOSIS — N179 Acute kidney failure, unspecified: Secondary | ICD-10-CM | POA: Diagnosis present

## 2021-11-27 DIAGNOSIS — N10 Acute tubulo-interstitial nephritis: Secondary | ICD-10-CM | POA: Diagnosis present

## 2021-11-27 DIAGNOSIS — M810 Age-related osteoporosis without current pathological fracture: Secondary | ICD-10-CM | POA: Diagnosis present

## 2021-11-27 DIAGNOSIS — Z79899 Other long term (current) drug therapy: Secondary | ICD-10-CM

## 2021-11-27 DIAGNOSIS — Z91030 Bee allergy status: Secondary | ICD-10-CM

## 2021-11-27 DIAGNOSIS — Z888 Allergy status to other drugs, medicaments and biological substances status: Secondary | ICD-10-CM

## 2021-11-27 DIAGNOSIS — I10 Essential (primary) hypertension: Secondary | ICD-10-CM | POA: Diagnosis present

## 2021-11-27 DIAGNOSIS — J4520 Mild intermittent asthma, uncomplicated: Secondary | ICD-10-CM | POA: Diagnosis present

## 2021-11-27 DIAGNOSIS — Z79890 Hormone replacement therapy (postmenopausal): Secondary | ICD-10-CM

## 2021-11-27 DIAGNOSIS — G9341 Metabolic encephalopathy: Secondary | ICD-10-CM | POA: Diagnosis present

## 2021-11-27 DIAGNOSIS — R4182 Altered mental status, unspecified: Secondary | ICD-10-CM | POA: Diagnosis not present

## 2021-11-27 DIAGNOSIS — R6520 Severe sepsis without septic shock: Secondary | ICD-10-CM | POA: Diagnosis present

## 2021-11-27 DIAGNOSIS — E785 Hyperlipidemia, unspecified: Secondary | ICD-10-CM | POA: Diagnosis present

## 2021-11-27 DIAGNOSIS — E861 Hypovolemia: Secondary | ICD-10-CM | POA: Diagnosis present

## 2021-11-27 DIAGNOSIS — G8929 Other chronic pain: Secondary | ICD-10-CM | POA: Diagnosis present

## 2021-11-27 DIAGNOSIS — M1990 Unspecified osteoarthritis, unspecified site: Secondary | ICD-10-CM | POA: Diagnosis present

## 2021-11-27 DIAGNOSIS — Z90710 Acquired absence of both cervix and uterus: Secondary | ICD-10-CM

## 2021-11-27 DIAGNOSIS — E871 Hypo-osmolality and hyponatremia: Secondary | ICD-10-CM | POA: Diagnosis present

## 2021-11-27 DIAGNOSIS — Z791 Long term (current) use of non-steroidal anti-inflammatories (NSAID): Secondary | ICD-10-CM

## 2021-11-27 DIAGNOSIS — E876 Hypokalemia: Secondary | ICD-10-CM | POA: Diagnosis present

## 2021-11-27 DIAGNOSIS — E878 Other disorders of electrolyte and fluid balance, not elsewhere classified: Secondary | ICD-10-CM | POA: Diagnosis present

## 2021-11-27 LAB — COMPREHENSIVE METABOLIC PANEL
ALT: 19 U/L (ref 0–44)
AST: 28 U/L (ref 15–41)
Albumin: 2.9 g/dL — ABNORMAL LOW (ref 3.5–5.0)
Alkaline Phosphatase: 125 U/L (ref 38–126)
Anion gap: 14 (ref 5–15)
BUN: 53 mg/dL — ABNORMAL HIGH (ref 8–23)
CO2: 19 mmol/L — ABNORMAL LOW (ref 22–32)
Calcium: 7.8 mg/dL — ABNORMAL LOW (ref 8.9–10.3)
Chloride: 89 mmol/L — ABNORMAL LOW (ref 98–111)
Creatinine, Ser: 4.06 mg/dL — ABNORMAL HIGH (ref 0.44–1.00)
GFR, Estimated: 12 mL/min — ABNORMAL LOW (ref >60)
Glucose, Bld: 105 mg/dL — ABNORMAL HIGH (ref 70–99)
Potassium: 4.8 mmol/L (ref 3.5–5.1)
Sodium: 122 mmol/L — ABNORMAL LOW (ref 135–145)
Total Bilirubin: 0.9 mg/dL (ref 0.3–1.2)
Total Protein: 7.1 g/dL (ref 6.5–8.1)

## 2021-11-27 LAB — CBC WITH DIFFERENTIAL/PLATELET
Abs Immature Granulocytes: 0.37 10*3/uL — ABNORMAL HIGH (ref 0.00–0.07)
Basophils Absolute: 0.1 10*3/uL (ref 0.0–0.1)
Basophils Relative: 1 %
Eosinophils Absolute: 0 10*3/uL (ref 0.0–0.5)
Eosinophils Relative: 0 %
HCT: 34.6 % — ABNORMAL LOW (ref 36.0–46.0)
Hemoglobin: 12.4 g/dL (ref 12.0–15.0)
Immature Granulocytes: 2 %
Lymphocytes Relative: 6 %
Lymphs Abs: 1 10*3/uL (ref 0.7–4.0)
MCH: 35.4 pg — ABNORMAL HIGH (ref 26.0–34.0)
MCHC: 35.8 g/dL (ref 30.0–36.0)
MCV: 98.9 fL (ref 80.0–100.0)
Monocytes Absolute: 0.5 10*3/uL (ref 0.1–1.0)
Monocytes Relative: 3 %
Neutro Abs: 13.6 10*3/uL — ABNORMAL HIGH (ref 1.7–7.7)
Neutrophils Relative %: 88 %
Platelets: 173 10*3/uL (ref 150–400)
RBC: 3.5 MIL/uL — ABNORMAL LOW (ref 3.87–5.11)
RDW: 11.5 % (ref 11.5–15.5)
Smear Review: NORMAL
WBC: 15.5 10*3/uL — ABNORMAL HIGH (ref 4.0–10.5)
nRBC: 0.3 % — ABNORMAL HIGH (ref 0.0–0.2)

## 2021-11-27 LAB — URINALYSIS, ROUTINE W REFLEX MICROSCOPIC
Bilirubin Urine: NEGATIVE
Glucose, UA: NEGATIVE mg/dL
Ketones, ur: 5 mg/dL — AB
Nitrite: NEGATIVE
Protein, ur: 100 mg/dL — AB
Specific Gravity, Urine: 1.015 (ref 1.005–1.030)
WBC, UA: >50 WBC/hpf — ABNORMAL HIGH (ref 0–5)
pH: 5 (ref 5.0–8.0)

## 2021-11-27 LAB — PROTIME-INR
INR: 1.2 (ref 0.8–1.2)
Prothrombin Time: 14.8 seconds (ref 11.4–15.2)

## 2021-11-27 LAB — LACTIC ACID, PLASMA: Lactic Acid, Venous: 1.3 mmol/L (ref 0.5–1.9)

## 2021-11-27 NOTE — ED Triage Notes (Signed)
Pt son states she was complaining of fever and fatigue yesterday and she said she was going to the doctor today. Pt son tried calling her multiple times and got no answer. Pt went and found mother at home laying in bed and confused. Son does not think she went to doctor.

## 2021-11-28 ENCOUNTER — Emergency Department: Payer: BC Managed Care – PPO

## 2021-11-28 ENCOUNTER — Inpatient Hospital Stay
Admission: EM | Admit: 2021-11-28 | Discharge: 2021-12-02 | DRG: 871 | Disposition: A | Discharge status: Home / Self Care | Payer: BC Managed Care – PPO | Attending: Internal Medicine | Admitting: Internal Medicine

## 2021-11-28 DIAGNOSIS — Z79890 Hormone replacement therapy (postmenopausal): Secondary | ICD-10-CM | POA: Diagnosis not present

## 2021-11-28 DIAGNOSIS — A419 Sepsis, unspecified organism: Secondary | ICD-10-CM

## 2021-11-28 DIAGNOSIS — A4150 Gram-negative sepsis, unspecified: Secondary | ICD-10-CM

## 2021-11-28 DIAGNOSIS — Z888 Allergy status to other drugs, medicaments and biological substances status: Secondary | ICD-10-CM | POA: Diagnosis not present

## 2021-11-28 DIAGNOSIS — E785 Hyperlipidemia, unspecified: Secondary | ICD-10-CM

## 2021-11-28 DIAGNOSIS — I10 Essential (primary) hypertension: Secondary | ICD-10-CM | POA: Diagnosis present

## 2021-11-28 DIAGNOSIS — M1990 Unspecified osteoarthritis, unspecified site: Secondary | ICD-10-CM | POA: Diagnosis present

## 2021-11-28 DIAGNOSIS — N390 Urinary tract infection, site not specified: Secondary | ICD-10-CM | POA: Diagnosis not present

## 2021-11-28 DIAGNOSIS — A4151 Sepsis due to Escherichia coli [E. coli]: Secondary | ICD-10-CM | POA: Diagnosis present

## 2021-11-28 DIAGNOSIS — G8929 Other chronic pain: Secondary | ICD-10-CM | POA: Diagnosis present

## 2021-11-28 DIAGNOSIS — J4520 Mild intermittent asthma, uncomplicated: Secondary | ICD-10-CM

## 2021-11-28 DIAGNOSIS — R000 Tachycardia, unspecified: Secondary | ICD-10-CM

## 2021-11-28 DIAGNOSIS — G9341 Metabolic encephalopathy: Secondary | ICD-10-CM

## 2021-11-28 DIAGNOSIS — Z79899 Other long term (current) drug therapy: Secondary | ICD-10-CM | POA: Diagnosis not present

## 2021-11-28 DIAGNOSIS — N12 Tubulo-interstitial nephritis, not specified as acute or chronic: Secondary | ICD-10-CM | POA: Diagnosis not present

## 2021-11-28 DIAGNOSIS — R7881 Bacteremia: Secondary | ICD-10-CM

## 2021-11-28 DIAGNOSIS — N179 Acute kidney failure, unspecified: Secondary | ICD-10-CM | POA: Diagnosis present

## 2021-11-28 DIAGNOSIS — G9340 Encephalopathy, unspecified: Secondary | ICD-10-CM | POA: Diagnosis not present

## 2021-11-28 DIAGNOSIS — L4050 Arthropathic psoriasis, unspecified: Secondary | ICD-10-CM | POA: Diagnosis present

## 2021-11-28 DIAGNOSIS — D696 Thrombocytopenia, unspecified: Secondary | ICD-10-CM | POA: Diagnosis present

## 2021-11-28 DIAGNOSIS — Z791 Long term (current) use of non-steroidal anti-inflammatories (NSAID): Secondary | ICD-10-CM | POA: Diagnosis not present

## 2021-11-28 DIAGNOSIS — N10 Acute tubulo-interstitial nephritis: Secondary | ICD-10-CM | POA: Diagnosis present

## 2021-11-28 DIAGNOSIS — E871 Hypo-osmolality and hyponatremia: Secondary | ICD-10-CM

## 2021-11-28 DIAGNOSIS — R6520 Severe sepsis without septic shock: Secondary | ICD-10-CM | POA: Diagnosis present

## 2021-11-28 DIAGNOSIS — E876 Hypokalemia: Secondary | ICD-10-CM

## 2021-11-28 DIAGNOSIS — M810 Age-related osteoporosis without current pathological fracture: Secondary | ICD-10-CM | POA: Diagnosis present

## 2021-11-28 DIAGNOSIS — J45909 Unspecified asthma, uncomplicated: Secondary | ICD-10-CM

## 2021-11-28 DIAGNOSIS — Z90710 Acquired absence of both cervix and uterus: Secondary | ICD-10-CM | POA: Diagnosis not present

## 2021-11-28 DIAGNOSIS — E878 Other disorders of electrolyte and fluid balance, not elsewhere classified: Secondary | ICD-10-CM | POA: Diagnosis present

## 2021-11-28 DIAGNOSIS — E861 Hypovolemia: Secondary | ICD-10-CM | POA: Diagnosis present

## 2021-11-28 DIAGNOSIS — R4182 Altered mental status, unspecified: Secondary | ICD-10-CM | POA: Diagnosis present

## 2021-11-28 DIAGNOSIS — Z881 Allergy status to other antibiotic agents status: Secondary | ICD-10-CM | POA: Diagnosis not present

## 2021-11-28 DIAGNOSIS — Z91030 Bee allergy status: Secondary | ICD-10-CM | POA: Diagnosis not present

## 2021-11-28 LAB — CBC
HCT: 31.6 % — ABNORMAL LOW (ref 36.0–46.0)
Hemoglobin: 10.9 g/dL — ABNORMAL LOW (ref 12.0–15.0)
MCH: 34.6 pg — ABNORMAL HIGH (ref 26.0–34.0)
MCHC: 34.5 g/dL (ref 30.0–36.0)
MCV: 100.3 fL — ABNORMAL HIGH (ref 80.0–100.0)
Platelets: 142 10*3/uL — ABNORMAL LOW (ref 150–400)
RBC: 3.15 MIL/uL — ABNORMAL LOW (ref 3.87–5.11)
RDW: 11.6 % (ref 11.5–15.5)
WBC: 14.6 10*3/uL — ABNORMAL HIGH (ref 4.0–10.5)
nRBC: 0.1 % (ref 0.0–0.2)

## 2021-11-28 LAB — BLOOD CULTURE ID PANEL (REFLEXED) - BCID2

## 2021-11-28 LAB — PROCALCITONIN
Procalcitonin: 74.11 ng/mL
Procalcitonin: 48.89 ng/mL

## 2021-11-28 LAB — HIV ANTIBODY (ROUTINE TESTING W REFLEX): HIV Screen 4th Generation wRfx: NONREACTIVE

## 2021-11-28 LAB — BASIC METABOLIC PANEL
Anion gap: 11 (ref 5–15)
BUN: 49 mg/dL — ABNORMAL HIGH (ref 8–23)
CO2: 16 mmol/L — ABNORMAL LOW (ref 22–32)
Calcium: 6.8 mg/dL — ABNORMAL LOW (ref 8.9–10.3)
Chloride: 100 mmol/L (ref 98–111)
Creatinine, Ser: 3.4 mg/dL — ABNORMAL HIGH (ref 0.44–1.00)
GFR, Estimated: 15 mL/min — ABNORMAL LOW (ref >60)
Glucose, Bld: 83 mg/dL (ref 70–99)
Potassium: 4.3 mmol/L (ref 3.5–5.1)
Sodium: 127 mmol/L — ABNORMAL LOW (ref 135–145)

## 2021-11-28 LAB — PROTIME-INR
INR: 1.3 — ABNORMAL HIGH (ref 0.8–1.2)
Prothrombin Time: 15.9 seconds — ABNORMAL HIGH (ref 11.4–15.2)

## 2021-11-28 LAB — CORTISOL-AM, BLOOD: Cortisol - AM: 22.8 ug/dL — ABNORMAL HIGH (ref 6.7–22.6)

## 2021-11-28 LAB — LACTIC ACID, PLASMA: Lactic Acid, Venous: 1.2 mmol/L (ref 0.5–1.9)

## 2021-11-28 MED ORDER — ONDANSETRON HCL 4 MG/2ML IJ SOLN: 4.0000 mg | Freq: Four times a day (QID) | INTRAMUSCULAR | Status: DC | PRN

## 2021-11-28 MED ORDER — SODIUM CHLORIDE 0.9 % IV SOLN
2.0000 g | INTRAVENOUS | Status: DC
  Administered 2021-11-28: 2 g via INTRAVENOUS
  Filled 2021-11-28 (×2): qty 20

## 2021-11-28 MED ORDER — LEVOFLOXACIN IN D5W 750 MG/150ML IV SOLN: 750.0000 mg | Freq: Once | INTRAVENOUS | Status: DC

## 2021-11-28 MED ORDER — ACETAMINOPHEN 650 MG RE SUPP: 650.0000 mg | Freq: Four times a day (QID) | RECTAL | Status: DC | PRN

## 2021-11-28 MED ORDER — GABAPENTIN 100 MG PO CAPS
100.0000 mg | ORAL_CAPSULE | Freq: Three times a day (TID) | ORAL | Status: DC
  Administered 2021-11-28 – 2021-12-02 (×13): 100 mg via ORAL
  Filled 2021-11-28 (×12): qty 1

## 2021-11-28 MED ORDER — SODIUM CHLORIDE 0.9 % IV BOLUS
2000.0000 mL | Freq: Once | INTRAVENOUS | Status: AC
  Administered 2021-11-28: 2000 mL via INTRAVENOUS

## 2021-11-28 MED ORDER — ROSUVASTATIN CALCIUM 10 MG PO TABS
10.0000 mg | ORAL_TABLET | ORAL | Status: DC
  Administered 2021-11-28 – 2021-11-30 (×2): 10 mg via ORAL
  Filled 2021-11-28 (×2): qty 1

## 2021-11-28 MED ORDER — SODIUM CHLORIDE 0.9 % IV SOLN: 1.0000 g | INTRAVENOUS | Status: DC

## 2021-11-28 MED ORDER — VALACYCLOVIR HCL 500 MG PO TABS
500.0000 mg | ORAL_TABLET | Freq: Every day | ORAL | Status: DC
  Administered 2021-11-28 – 2021-12-02 (×5): 500 mg via ORAL
  Filled 2021-11-28 (×5): qty 1

## 2021-11-28 MED ORDER — ESTRADIOL 1 MG PO TABS
0.5000 mg | ORAL_TABLET | Freq: Every day | ORAL | Status: DC
  Administered 2021-11-28 – 2021-12-02 (×5): 0.5 mg via ORAL
  Filled 2021-11-28 (×5): qty 0.5

## 2021-11-28 MED ORDER — ALBUTEROL SULFATE (2.5 MG/3ML) 0.083% IN NEBU: 2.5000 mg | INHALATION_SOLUTION | Freq: Four times a day (QID) | RESPIRATORY_TRACT | Status: DC | PRN

## 2021-11-28 MED ORDER — LACTATED RINGERS IV SOLN: INTRAVENOUS | Status: AC

## 2021-11-28 MED ORDER — MORPHINE SULFATE (PF) 2 MG/ML IV SOLN: 2.0000 mg | INTRAVENOUS | Status: DC | PRN

## 2021-11-28 MED ORDER — HEPARIN SODIUM (PORCINE) 5000 UNIT/ML IJ SOLN
5000.0000 [IU] | Freq: Three times a day (TID) | INTRAMUSCULAR | Status: DC
  Administered 2021-11-28 – 2021-12-02 (×13): 5000 [IU] via SUBCUTANEOUS
  Filled 2021-11-28 (×13): qty 1

## 2021-11-28 MED ORDER — TRAZODONE HCL 50 MG PO TABS
25.0000 mg | ORAL_TABLET | Freq: Every evening | ORAL | Status: DC | PRN
  Administered 2021-11-28 – 2021-12-01 (×4): 25 mg via ORAL
  Filled 2021-11-28 (×4): qty 1

## 2021-11-28 MED ORDER — CYCLOBENZAPRINE HCL 10 MG PO TABS
5.0000 mg | ORAL_TABLET | Freq: Two times a day (BID) | ORAL | Status: DC | PRN
  Administered 2021-11-28: 5 mg via ORAL
  Filled 2021-11-28: qty 1

## 2021-11-28 MED ORDER — ONDANSETRON HCL 4 MG PO TABS: 4.0000 mg | ORAL_TABLET | Freq: Four times a day (QID) | ORAL | Status: DC | PRN

## 2021-11-28 MED ORDER — SODIUM CHLORIDE 0.9 % IV SOLN
2.0000 g | INTRAVENOUS | Status: DC
  Administered 2021-11-28: 2 g via INTRAVENOUS
  Filled 2021-11-28: qty 20

## 2021-11-28 MED ORDER — ACETAMINOPHEN 325 MG PO TABS
650.0000 mg | ORAL_TABLET | Freq: Four times a day (QID) | ORAL | Status: DC | PRN
  Administered 2021-11-28 – 2021-12-01 (×6): 650 mg via ORAL
  Filled 2021-11-28 (×6): qty 2

## 2021-11-28 MED ORDER — ENOXAPARIN SODIUM 40 MG/0.4ML IJ SOSY: 40.0000 mg | PREFILLED_SYRINGE | INTRAMUSCULAR | Status: DC

## 2021-11-28 MED ORDER — METOPROLOL SUCCINATE ER 50 MG PO TB24
25.0000 mg | ORAL_TABLET | Freq: Every day | ORAL | Status: DC
  Administered 2021-11-28: 25 mg via ORAL
  Filled 2021-11-28: qty 1

## 2021-11-28 MED ORDER — MAGNESIUM HYDROXIDE 400 MG/5ML PO SUSP: 30.0000 mL | Freq: Every day | ORAL | Status: DC | PRN

## 2021-11-28 MED ORDER — GABAPENTIN 300 MG PO CAPS
300.0000 mg | ORAL_CAPSULE | Freq: Three times a day (TID) | ORAL | Status: DC
  Filled 2021-11-28: qty 1

## 2021-11-28 MED ORDER — ALBUTEROL SULFATE HFA 108 (90 BASE) MCG/ACT IN AERS
1.0000 | INHALATION_SPRAY | RESPIRATORY_TRACT | Status: DC | PRN
Start: 2021-11-28 — End: 2021-11-28

## 2021-11-28 MED ORDER — SODIUM CHLORIDE 0.9 % IV SOLN: INTRAVENOUS | Status: DC

## 2021-11-28 NOTE — Progress Notes (Signed)
Anticoagulation monitoring(Lovenox):  61 yo female ordered Lovenox 40 mg Q24h    Filed Weights   11/27/21 2236  Weight: 51.7 kg (114 lb)   BMI 20.85   Lab Results  Component Value Date   CREATININE 4.06 (H) 11/27/2021   CREATININE 0.7 05/18/2021   Estimated Creatinine Clearance: 11.5 mL/min (A) (by C-G formula based on SCr of 4.06 mg/dL (H)). Hemoglobin & Hematocrit     Component Value Date/Time   HGB 12.4 11/27/2021 2242   HCT 34.6 (L) 11/27/2021 2242     Per Protocol for Patient with estCrcl < 15 ml/min and BMI < 30, will transition to Heparin 5000 units SQ Q8H.      ve

## 2021-11-28 NOTE — Sepsis Progress Note (Signed)
Elink following Code Sepsis. 

## 2021-11-28 NOTE — Assessment & Plan Note (Signed)
-   The patient will be hydrated with IV normal saline and will follow BMPs. - We will avoid nephrotoxins.

## 2021-11-28 NOTE — Progress Notes (Signed)
CODE SEPSIS - PHARMACY COMMUNICATION  **Broad Spectrum Antibiotics should be administered within 1 hour of Sepsis diagnosis**  Time Code Sepsis Called/Page Received: 8/2 @ 0044   Antibiotics Ordered: Ceftriaxone 2 gm , Azithromycin 500 mg   Time of 1st antibiotic administration: Ceftriaxone 2 gm IV X 1 on 8/2 @ 0104  Additional action taken by pharmacy:   If necessary, Name of Provider/Nurse Contacted:     Tremain Rucinski D ,PharmD Clinical Pharmacist  11/28/2021  1:15 AM

## 2021-11-28 NOTE — H&P (Signed)
[place]   PATIENT NAME: Jane Sanchez    MR#:  [id number]  DATE OF BIRTH:  05/03/1960  DATE OF ADMISSION:  11/28/2021  PRIMARY CARE PHYSICIAN: Smitty Cords, DO   Patient is coming from: Home  REQUESTING/REFERRING PHYSICIAN: Delton Prairie, MD  CHIEF COMPLAINT:   Chief Complaint  Patient presents with   Altered Mental Status    HISTORY OF PRESENT ILLNESS:  Jane Sanchez is a 61 y.o. female with medical history significant for asthma, hypertension, dyslipidemia, osteoporosis and osteoarthritis, presented to the emergency room with acute onset of altered mental status with confusion.  The patient was fairly repetitive per her son and admits to having recent dysuria with associated urinary frequency, urgency and left flank pain.  She has been having fever and chills.  Her Tmax was 10 2-1 03 per her report.  No chest pain or dyspnea or cough.  No headache or dizziness or blurred vision.  She felt overall as if she was having the flu per her report.  ED Course: When she came to the ER temperature was 99.3 and blood pressure was 103/91 with a heart rate of 135.  Labs revealed hyponatremia 122 and hypochloremia of 89 with a CO2 of 19 blood glucose 106 and BUN 53 with a creatinine of 4.06 previously normal in January of this year, calcium 7.8 and albumin 2.9 with otherwise unremarkable LFTs.  Lactic acid was 1.3 and later 1.2 procalcitonin level was 74 point CBC showed leukocytosis 15.5 with neutrophilia and bandemia INR was 1.2 and PT 14.8 EKG as reviewed by me : EKG showed sinus tachycardia with a rate of 137 with low voltage QRS and Q waves anteroseptally. Imaging: Portable chest x-ray showed mild diffuse interstitial prominence that may represent mild edema no focal consolidation. Non-contrasted head CT scan revealed no intracranial normalities.  CT renal stone study revealed the following: 1. Stranding around the left kidney and ureter. No stone or hydronephrosis.  Changes likely to represent pyelonephritis. 2. Bladder wall thickening suggesting cystitis. 3. Large esophageal hiatal hernia. 4. Aortic atherosclerosis. 5. Multiple endplate compression deformities in the lumbar spine suggesting osteoporosis.  The patient was given 2 L bolus of IV normal saline and and ordered IV Rocephin.  She will be admitted to a progressive unit bed for further evaluation and management.  PAST MEDICAL HISTORY:   Past Medical History:  Diagnosis Date   Arthritis    Asthma    Hyperlipidemia    Hypertension    Osteoporosis     PAST SURGICAL HISTORY:   Past Surgical History:  Procedure Laterality Date   BREAST BIOPSY Bilateral    benign cysts   CESAREAN SECTION  1986   1989, 1991   KNEE SURGERY Left 1983   MENISCECTOMY Right 2011   TOTAL ABDOMINAL HYSTERECTOMY  2001   removal of cervix as well    SOCIAL HISTORY:   Social History   Tobacco Use   Smoking status: Never   Smokeless tobacco: Never  Substance Use Topics   Alcohol use: Yes    Alcohol/week: 2.0 standard drinks of alcohol    Types: 2 Glasses of wine per week    FAMILY HISTORY:   Family History  Problem Relation Age of Onset   Breast cancer Mother    Bladder Cancer Mother    Breast cancer Maternal Aunt     DRUG ALLERGIES:   Allergies  Allergen Reactions   Calcitonin Hives   Cephalosporins Other (See Comments)  Bee Venom Swelling    Severe swelling and hyperventilate    Cephalexin Other (See Comments)    bruising Pt states that this caused a blood disorder causing bruising Bleeding disorder with bruising    Adalimumab Rash    REVIEW OF SYSTEMS:   ROS As per history of present illness. All pertinent systems were reviewed above. Constitutional, HEENT, cardiovascular, respiratory, GI, GU, musculoskeletal, neuro, psychiatric, endocrine, integumentary and hematologic systems were reviewed and are otherwise negative/unremarkable except for positive findings mentioned above  in the HPI.   MEDICATIONS AT HOME:   Prior to Admission medications   Medication Sig Start Date End Date Taking? Authorizing Provider  CIMZIA 2 X 200 MG/ML PSKT Inject into the skin. 11/09/21  Yes [provider]  denosumab (PROLIA) 60 MG/ML SOSY injection Inject 60 mg into the skin every 6 (six) months.   Yes [provider]  estradiol (ESTRACE) 0.5 MG tablet TAKE 1 TABLET BY MOUTH ONCE DAILY 11/06/21  Yes Karamalegos, Netta Neat, DO  gabapentin (NEURONTIN) 300 MG capsule Take 1 capsule (300 mg total) by mouth 3 (three) times daily. 12/15/20  Yes Karamalegos, Netta Neat, DO  losartan (COZAAR) 50 MG tablet Take 1 tablet (50 mg total) by mouth daily. 06/19/21  Yes Karamalegos, Netta Neat, DO  metoprolol succinate (TOPROL-XL) 25 MG 24 hr tablet Take 1 tablet (25 mg total) by mouth daily. 06/19/21  Yes Karamalegos, Netta Neat, DO  rosuvastatin (CRESTOR) 10 MG tablet Take 1 tablet (10 mg total) by mouth 3 (three) times a week. 12/15/20  Yes Karamalegos, Netta Neat, DO  valACYclovir (VALTREX) 500 MG tablet Take 1 tablet (500 mg total) by mouth daily. For suppression 12/15/20  Yes Karamalegos, Alexander J, DO  albuterol (PROVENTIL) (2.5 MG/3ML) 0.083% nebulizer solution Take 3 mLs (2.5 mg total) by nebulization every 6 (six) hours as needed for wheezing or shortness of breath. 03/19/21   Karamalegos, Netta Neat, DO  albuterol (VENTOLIN HFA) 108 (90 Base) MCG/ACT inhaler Inhale 1-2 puffs into the lungs every 4 (four) hours as needed for wheezing or shortness of breath. 03/19/21   Karamalegos, Netta Neat, DO  cyclobenzaprine (FLEXERIL) 10 MG tablet Take 0.5-1 tablets (5-10 mg total) by mouth 2 (two) times daily as needed for muscle spasms. 06/19/21   Karamalegos, Netta Neat, DO      VITAL SIGNS:  Blood pressure 112/78, pulse (!) 112, temperature 98.9 F (37.2 C), temperature source Oral, resp. rate (!) 23, height 5\' 2"  (1.575 m), weight 51.7 kg, SpO2 100 %.  PHYSICAL EXAMINATION:   Physical Exam  GENERAL:  61 y.o.-year-old Caucasian female patient lying in the bed with no acute distress.  EYES: Pupils equal, round, reactive to light and accommodation. No scleral icterus. Extraocular muscles intact.  HEENT: Head atraumatic, normocephalic. Oropharynx and nasopharynx clear.  NECK:  Supple, no jugular venous distention. No thyroid enlargement, no tenderness.  LUNGS: Normal breath sounds bilaterally, no wheezing, rales,rhonchi or crepitation. No use of accessory muscles of respiration.  CARDIOVASCULAR: Regular rate and rhythm, S1, S2 normal. No murmurs, rubs, or gallops.  ABDOMEN: Soft, nondistended, nontender. Bowel sounds present. No organomegaly or mass.  She had left CVA tenderness EXTREMITIES: No pedal edema, cyanosis, or clubbing.  NEUROLOGIC: Cranial nerves II through XII are intact. Muscle strength 5/5 in all extremities. Sensation intact. Gait not checked.  PSYCHIATRIC: The patient is alert and oriented x 3.  Normal affect and good eye contact. SKIN: No obvious rash, lesion, or ulcer.   LABORATORY PANEL:   CBC  Recent Labs  Lab 11/27/21 2242  WBC 15.5*  HGB 12.4  HCT 34.6*  PLT 173   ------------------------------------------------------------------------------------------------------------------  Chemistries  Recent Labs  Lab 11/27/21 2242  NA 122*  K 4.8  CL 89*  CO2 19*  GLUCOSE 105*  BUN 53*  CREATININE 4.06*  CALCIUM 7.8*  AST 28  ALT 19  ALKPHOS 125  BILITOT 0.9   ------------------------------------------------------------------------------------------------------------------  Cardiac Enzymes No results for input(s): "TROPONINI" in the last 168 hours. ------------------------------------------------------------------------------------------------------------------  RADIOLOGY:  CT HEAD WO CONTRAST ( )  Result Date: 11/28/2021 CLINICAL DATA:  Altered mental status. EXAM: CT HEAD WITHOUT CONTRAST TECHNIQUE: Contiguous axial images  were obtained from the base of the skull through the vertex without intravenous contrast. RADIATION DOSE REDUCTION: This exam was performed according to the departmental dose-optimization program which includes automated exposure control, adjustment of the mA and/or kV according to patient size and/or use of iterative reconstruction technique. COMPARISON:  None Available. FINDINGS: Brain: The ventricles and sulci are appropriate size for the patient's age. The gray-white matter discrimination is preserved. There is no acute intracranial hemorrhage. No mass effect or midline shift. No extra-axial fluid collection. Vascular: No hyperdense vessel or unexpected calcification. Skull: Normal. Negative for fracture or focal lesion. Sinuses/Orbits: Mild mucoperiosteal thickening of paranasal sinuses with opacification of several ethmoid air cells. No air-fluid. The mid of the visualized paranasal sinuses and mastoid air cells are clear. Other: None IMPRESSION: No acute intracranial pathology. Electronically Signed   By: Elgie Collard M.D.   On: 11/28/2021 01:57   CT Renal Stone Study  Result Date: 11/28/2021 CLINICAL DATA:  Sepsis, urinary tract infection, right CVA tenderness. EXAM: CT ABDOMEN AND PELVIS WITHOUT CONTRAST TECHNIQUE: Multidetector CT imaging of the abdomen and pelvis was performed following the standard protocol without IV contrast. RADIATION DOSE REDUCTION: This exam was performed according to the departmental dose-optimization program which includes automated exposure control, adjustment of the mA and/or kV according to patient size and/or use of iterative reconstruction technique. COMPARISON:  None Available. FINDINGS: Lower chest: Lung bases are clear. Large esophageal hiatal hernia behind the heart. Hepatobiliary: No focal liver abnormality is seen. No gallstones, gallbladder wall thickening, or biliary dilatation. Pancreas: Unremarkable. No pancreatic ductal dilatation or surrounding inflammatory  changes. Spleen: Normal in size without focal abnormality. Adrenals/Urinary Tract: No adrenal gland nodules. Stranding around the left kidney and ureter. No hydronephrosis or hydroureter. No stones are identified. Changes likely to represent pyelonephritis although this could also indicate sequela from a recently passed stone in the appropriate clinical setting. Bladder wall is thickened, suggesting cystitis. Right kidney is normal. Stomach/Bowel: Stomach is within normal limits. Appendix is not identified. No evidence of bowel wall thickening, distention, or inflammatory changes. Vascular/Lymphatic: Aortic atherosclerosis. No enlarged abdominal or pelvic lymph nodes. Reproductive: Status post hysterectomy. No adnexal masses. Other: No abdominal wall hernia or abnormality. No abdominopelvic ascites. Musculoskeletal: Degenerative changes. Mild lumbar scoliosis convex towards the left. Multiple endplate compression deformities in the lumbar spine, likely indicating osteoporosis. IMPRESSION: 1. Stranding around the left kidney and ureter. No stone or hydronephrosis. Changes likely to represent pyelonephritis. 2. Bladder wall thickening suggesting cystitis. 3. Large esophageal hiatal hernia. 4. Aortic atherosclerosis. 5. Multiple endplate compression deformities in the lumbar spine suggesting osteoporosis. Electronically Signed   By: Burman Nieves M.D.   On: 11/28/2021 01:56   DG Chest Port 1 View  Result Date: 11/27/2021 CLINICAL DATA:  Fever and fatigue. EXAM: PORTABLE CHEST 1 VIEW COMPARISON:  None Available. FINDINGS: Mild diffuse interstitial  prominence may represent mild edema. No focal consolidation, pleural effusion or pneumothorax. The cardiac silhouette is within limits. Small hiatal hernia. No acute osseous pathology. IMPRESSION: Mild diffuse interstitial prominence may represent mild edema. No focal consolidation. Electronically Signed   By: Elgie Collard M.D.   On: 11/27/2021 22:50       IMPRESSION AND PLAN:  Assessment and Plan: * Sepsis due to gram-negative UTI [facility]) - The patient will be admitted to a progressive unit bed. - She has acute left pyelonephritis. - Sepsis manifested by reported fever of 102-103 at home, tachypnea, tachycardia and leukocytosis. - She will be placed on hydration with IV normal saline. - We will continue antibiotic therapy with IV Levaquin. - We will follow blood and urine cultures.  Severe sepsis (HCC) - This is manifested by acute kidney injury and altered mental status with confusion. - Management as above.  AKI (acute kidney injury) (HCC) - The patient will be hydrated with IV normal saline and will follow BMPs. - We will avoid nephrotoxins.  Dyslipidemia - We will continue statin therapy.  Asthma, chronic - She has no current exacerbation. - We will continue her inhalers.  Essential hypertension - We will continue Norvasc and Toprol-XL and hold off Cozaar given acute kidney injury.  Psoriatic arthritis (HCC) - The patient is on apremilast and Remicade.       DVT prophylaxis: Lovenox.  Advanced Care Planning:  Code Status: full code.  Family Communication:  The plan of care was discussed in details with the patient (and family). I answered all questions. The patient agreed to proceed with the above mentioned plan. Further management will depend upon hospital course. Disposition Plan: Back to previous home environment Consults called: none.  All the records are reviewed and case discussed with ED provider.  Status is: Inpatient    At the time of the admission, it appears that the appropriate admission status for this patient is inpatient.  This is judged to be reasonable and necessary in order to provide the required intensity of service to ensure the patient's safety given the presenting symptoms, physical exam findings and initial radiographic and laboratory data in the context of comorbid conditions.  The  patient requires inpatient status due to high intensity of service, high risk of further deterioration and high frequency of surveillance required.  I certify that at the time of admission, it is my clinical judgment that the patient will require inpatient hospital care extending more than 2 midnights.                            Dispo: The patient is from: Home              Anticipated d/c is to: Home              Patient currently is not medically stable to d/c.              Difficult to place patient: No  Hannah Beat M.D on 11/28/2021 at 3:01 AM  [facility]   From 7 PM-7 AM, contact night-coverage [email or web address]  CC: Primary care physician; Smitty Cords, DO

## 2021-11-28 NOTE — Assessment & Plan Note (Signed)
-   This is manifested by acute kidney injury and altered mental status with confusion. - Management as above.

## 2021-11-28 NOTE — Assessment & Plan Note (Signed)
-   She has no current exacerbation. - We will continue her inhalers. 

## 2021-11-28 NOTE — Progress Notes (Signed)
This is a no charge noted patient was admitted this AM.  Patient seen and examined.  H&P reviewed.  Jane Sanchez is a 61 y.o. female with medical history significant for asthma, hypertension, dyslipidemia, osteoporosis and osteoarthritis, presented to the emergency room with acute onset of altered mental status with confusion.  The patient was fairly repetitive per her son and admits to having recent dysuria with associated urinary frequency, urgency and left flank pain.  She has been having fever and chills.  Her Tmax was 10 2-1 03 per her report.  No chest pain or dyspnea or cough.  No headache or dizziness or blurred vision.  She felt overall as if she was having the flu per her report.   ED Course: When she came to the ER temperature was 99.3 and blood pressure was 103/91 with a heart rate of 135.  Labs revealed hyponatremia 122 and hypochloremia of 89 with a CO2 of 19 blood glucose 106 and BUN 53 with a creatinine of 4.06 previously normal in January of this year, calcium 7.8 and albumin 2.9 with otherwise unremarkable LFTs.  Lactic acid was 1.3 and later 1.2 procalcitonin level was 74 point CBC showed leukocytosis 15.5 with neutrophilia and bandemia INR was 1.2 and PT 14.8  CT renal stone study revealed the following: 1. Stranding around the left kidney and ureter. No stone or hydronephrosis. Changes likely to represent pyelonephritis  No cva tenderness.no new complaints  Cta  Reg s1/s2  Soft benign +bs No edema  A/P Continue IV antibiotics, continue IV fluids Follow-up cultures Change rocephin to 2gm iv

## 2021-11-28 NOTE — Assessment & Plan Note (Signed)
-   The patient is on apremilast and Remicade.

## 2021-11-28 NOTE — ED Provider Notes (Signed)
[facility] Provider Note    Event Date/Time   First MD Initiated Contact with Patient 11/28/21 0032     (approximate)   History   Altered Mental Status   HPI  Jane Sanchez is a 61 y.o. female who presents to the ED for evaluation of Altered Mental Status   I reviewed PCP visit from 11/21.  History of mild intermittent asthma, HTN, HLD.  Patient presents to the ED, at the urging of her son, for evaluation of altered mentation.  Patient reports that her son is taking this too far and is overreacting.  She reports that she is fine and that he thought that she was crazy. Reports feeling like she had the "flu" for the past 2 days with fevers and feeling sick overall.  Reports poor intake without diarrhea or emesis. Reports urinary hesitancy, frequency and sensation of incomplete emptying without dysuria or hematuria.  Denies any recent antibiotics.  Son reports that patient was last seen normal this past Friday.  She has been acting weird, repetitive, confused and generalized not herself for the past 1-2 days.  He talked with her on the phone yesterday and saw her today.   Physical Exam   Triage Vital Signs: ED Triage Vitals  Enc Vitals Group     BP 11/27/21 2235 (!) 103/91     Pulse Rate 11/27/21 2235 (!) 135     Resp 11/27/21 2235 19     Temp 11/27/21 2235 99.3 F (37.4 C)     Temp Source 11/27/21 2235 Oral     SpO2 11/27/21 2235 100 %     Weight 11/27/21 2236 114 lb (51.7 kg)     Height 11/27/21 2236 5\' 2"  (1.575 m)     Head Circumference --      Peak Flow --      Pain Score 11/27/21 2236 0     Pain Loc --      Pain Edu? --      Excl. in GC? --     Most recent vital signs: Vitals:   11/28/21 0025 11/28/21 0105  BP: 120/74 117/79  Pulse:  (!) 122  Resp:  (!) 24  Temp:    SpO2:  100%    General: Awake, no distress.  Loquacious.  Oriented to self, year, location and situation. CV:  Good peripheral perfusion.  Sinus tachycardia  without appreciable murmur Resp:  Normal effort.  CTA B Abd:  No distention.  Suprapubic tenderness and right-sided CVA tenderness.  No peritoneal features and upper abdomen is benign anteriorly MSK:  No deformity noted.  Neuro:  No focal deficits appreciated.  Cranial nerves II through XII intact 5/5 strength and sensation in all 4 extremities Other:     ED Results / Procedures / Treatments   Labs (all labs ordered are listed, but only abnormal results are displayed) Labs Reviewed  COMPREHENSIVE METABOLIC PANEL - Abnormal; Notable for the following components:      Result Value   Sodium 122 (*)    Chloride 89 (*)    CO2 19 (*)    Glucose, Bld 105 (*)    BUN 53 (*)    Creatinine, Ser 4.06 (*)    Calcium 7.8 (*)    Albumin 2.9 (*)    GFR, Estimated 12 (*)    All other components within normal limits  CBC WITH DIFFERENTIAL/PLATELET - Abnormal; Notable for the following components:   WBC 15.5 (*)    RBC 3.50 (*)  HCT 34.6 (*)    MCH 35.4 (*)    nRBC 0.3 (*)    Neutro Abs 13.6 (*)    Abs Immature Granulocytes 0.37 (*)    All other components within normal limits  URINALYSIS, ROUTINE W REFLEX MICROSCOPIC - Abnormal; Notable for the following components:   Color, Urine AMBER (*)    APPearance TURBID (*)    Hgb urine dipstick MODERATE (*)    Ketones, ur 5 (*)    Protein, ur 100 (*)    Leukocytes,Ua LARGE (*)    WBC, UA >50 (*)    Bacteria, UA MANY (*)    All other components within normal limits  CULTURE, BLOOD (ROUTINE X 2)  URINE CULTURE  LACTIC ACID, PLASMA  LACTIC ACID, PLASMA  PROTIME-INR  PROCALCITONIN    EKG Sinus tachycardia with a rate of 137 bpm.  Normal axis and intervals.  No clear signs of acute ischemia.  RADIOLOGY CXR interpreted by me without evidence of acute cardiopulmonary pathology. CT head interpreted by me without evidence of acute intracranial pathology CT renal study interpreted by me without evidence of urologic obstruction  Official  radiology report(s): CT HEAD WO CONTRAST ( )  Result Date: 11/28/2021 CLINICAL DATA:  Altered mental status. EXAM: CT HEAD WITHOUT CONTRAST TECHNIQUE: Contiguous axial images were obtained from the base of the skull through the vertex without intravenous contrast. RADIATION DOSE REDUCTION: This exam was performed according to the departmental dose-optimization program which includes automated exposure control, adjustment of the mA and/or kV according to patient size and/or use of iterative reconstruction technique. COMPARISON:  None Available. FINDINGS: Brain: The ventricles and sulci are appropriate size for the patient's age. The gray-white matter discrimination is preserved. There is no acute intracranial hemorrhage. No mass effect or midline shift. No extra-axial fluid collection. Vascular: No hyperdense vessel or unexpected calcification. Skull: Normal. Negative for fracture or focal lesion. Sinuses/Orbits: Mild mucoperiosteal thickening of paranasal sinuses with opacification of several ethmoid air cells. No air-fluid. The mid of the visualized paranasal sinuses and mastoid air cells are clear. Other: None IMPRESSION: No acute intracranial pathology. Electronically Signed   By: Elgie Collard M.D.   On: 11/28/2021 01:57   CT Renal Stone Study  Result Date: 11/28/2021 CLINICAL DATA:  Sepsis, urinary tract infection, right CVA tenderness. EXAM: CT ABDOMEN AND PELVIS WITHOUT CONTRAST TECHNIQUE: Multidetector CT imaging of the abdomen and pelvis was performed following the standard protocol without IV contrast. RADIATION DOSE REDUCTION: This exam was performed according to the departmental dose-optimization program which includes automated exposure control, adjustment of the mA and/or kV according to patient size and/or use of iterative reconstruction technique. COMPARISON:  None Available. FINDINGS: Lower chest: Lung bases are clear. Large esophageal hiatal hernia behind the heart. Hepatobiliary: No  focal liver abnormality is seen. No gallstones, gallbladder wall thickening, or biliary dilatation. Pancreas: Unremarkable. No pancreatic ductal dilatation or surrounding inflammatory changes. Spleen: Normal in size without focal abnormality. Adrenals/Urinary Tract: No adrenal gland nodules. Stranding around the left kidney and ureter. No hydronephrosis or hydroureter. No stones are identified. Changes likely to represent pyelonephritis although this could also indicate sequela from a recently passed stone in the appropriate clinical setting. Bladder wall is thickened, suggesting cystitis. Right kidney is normal. Stomach/Bowel: Stomach is within normal limits. Appendix is not identified. No evidence of bowel wall thickening, distention, or inflammatory changes. Vascular/Lymphatic: Aortic atherosclerosis. No enlarged abdominal or pelvic lymph nodes. Reproductive: Status post hysterectomy. No adnexal masses. Other: No abdominal wall hernia or  abnormality. No abdominopelvic ascites. Musculoskeletal: Degenerative changes. Mild lumbar scoliosis convex towards the left. Multiple endplate compression deformities in the lumbar spine, likely indicating osteoporosis. IMPRESSION: 1. Stranding around the left kidney and ureter. No stone or hydronephrosis. Changes likely to represent pyelonephritis. 2. Bladder wall thickening suggesting cystitis. 3. Large esophageal hiatal hernia. 4. Aortic atherosclerosis. 5. Multiple endplate compression deformities in the lumbar spine suggesting osteoporosis. Electronically Signed   By: Lucienne Capers M.D.   On: 11/28/2021 01:56   DG Chest Port 1 View  Result Date: 11/27/2021 CLINICAL DATA:  Fever and fatigue. EXAM: PORTABLE CHEST 1 VIEW COMPARISON:  None Available. FINDINGS: Mild diffuse interstitial prominence may represent mild edema. No focal consolidation, pleural effusion or pneumothorax. The cardiac silhouette is within limits. Small hiatal hernia. No acute osseous pathology.  IMPRESSION: Mild diffuse interstitial prominence may represent mild edema. No focal consolidation. Electronically Signed   By: Anner Crete M.D.   On: 11/27/2021 22:50    PROCEDURES and INTERVENTIONS:  .1-3 Lead EKG Interpretation  Performed by: Vladimir Crofts, MD Authorized by: Vladimir Crofts, MD     Interpretation: abnormal     ECG rate:  130   ECG rate assessment: tachycardic     Rhythm: sinus tachycardia     Ectopy: none     Conduction: normal   .Critical Care  Performed by: Vladimir Crofts, MD Authorized by: Vladimir Crofts, MD   Critical care provider statement:    Critical care time (minutes):  30   Critical care time was exclusive of:  Separately billable procedures and treating other patients   Critical care was necessary to treat or prevent imminent or life-threatening deterioration of the following conditions:  Sepsis   Critical care was time spent personally by me on the following activities:  Development of treatment plan with patient or surrogate, discussions with consultants, evaluation of patient's response to treatment, examination of patient, ordering and review of laboratory studies, ordering and review of radiographic studies, ordering and performing treatments and interventions, pulse oximetry, re-evaluation of patient's condition and review of old charts   Medications  lactated ringers infusion ( Intravenous New Bag/Given 11/28/21 0105)  cefTRIAXone (ROCEPHIN) 2 g in sodium chloride 0.9 % 100 mL IVPB (0 g Intravenous Stopped 11/28/21 0207)  sodium chloride 0.9 % bolus 2,000 mL (2,000 mLs Intravenous Bolus 11/28/21 0104)     IMPRESSION / MDM / [place] / ED COURSE  I reviewed the triage vital signs and the nursing notes.  Differential diagnosis includes, but is not limited to, sepsis, stroke, intracranial hemorrhage, electrolyte disturbance, UTI, AKI, hyperkalemia  {Patient presents with symptoms of an acute illness or injury that is potentially  life-threatening.  Fairly healthy 61 year old female presents to the ED with generalized altered mentation, with evidence of sepsis due to acute cystitis with associated metabolic encephalopathy and AKI, requiring medical admission.  She looks systemically well, is talkative and somewhat repetitive.  No signs of trauma or neurologic deficits.  Blood work with leukocytosis, AKI and hyponatremia.  Urine with infectious features will be sent for cultures.  Subsequent close follow-up for acute cystitis.  We will initiate fluid resuscitation, ceftriaxone.  Will perform CT renal study to assess for obstructive pathology in the setting of her AKI, as well as CT head to ensure no CNS pathology contributing to her nonfocal encephalopathy.  We will consult with medicine for admission.  Clinical Course as of 11/28/21 0217  Wed Nov 28, 2021  0210 I consult with hospitalist who agrees  to admit [DS]    Clinical Course User Index [DS] Vladimir Crofts, MD     FINAL CLINICAL IMPRESSION(S) / ED DIAGNOSES   Final diagnoses:  AKI (acute kidney injury) ([place])  Sepsis with acute renal failure and tubular necrosis without septic shock, due to unspecified organism [facility])  Metabolic encephalopathy  Hyponatremia  Sinus tachycardia     Rx / DC Orders   ED Discharge Orders     None        Note:  This document was prepared using Dragon voice recognition software and may include unintentional dictation errors.   Vladimir Crofts, MD 11/28/21 [phone]

## 2021-11-28 NOTE — Assessment & Plan Note (Signed)
-   We will continue Norvasc and Toprol-XL and hold off Cozaar given acute kidney injury.

## 2021-11-28 NOTE — Assessment & Plan Note (Signed)
-   We will continue statin therapy. 

## 2021-11-28 NOTE — Assessment & Plan Note (Addendum)
-   The patient will be admitted to a progressive unit bed. - She has acute left pyelonephritis. - Sepsis manifested by reported fever of 102-103 at home, tachypnea, tachycardia and leukocytosis. - She will be placed on hydration with IV normal saline. - We will continue antibiotic therapy with IV Levaquin. - We will follow blood and urine cultures.

## 2021-11-28 NOTE — Progress Notes (Signed)
PHARMACY - PHYSICIAN COMMUNICATION CRITICAL VALUE ALERT - BLOOD CULTURE IDENTIFICATION (BCID)  Jane Sanchez is an 61 y.o. female who presented to [facility] on 11/28/2021 with a chief complaint of sepsis  Assessment:  E Coli in 1 of 2 bottles (anaerobic)  (include suspected source if known)  Name of physician (or Provider) Contacted:  Manuela Schwartz, NP  Current antibiotics: Ceftriaxone 2 gm IV Q24H   Changes to prescribed antibiotics recommended:  Patient is on recommended antibiotics - No changes needed  No results found for this or any previous visit.  Shenell Rogalski D 11/28/2021  11:20 PM

## 2021-11-28 NOTE — Progress Notes (Signed)
Pharmacy Antibiotic Note  Jane Sanchez is a 61 y.o. female admitted on 11/28/2021 with UTI.  Pharmacy has been consulted for levaquin/ceftriaxone dosing.  Pt has cephalexin / cephalosporins listed as allergy, "bruising" to cephalexin.  Pt had dose of ceftriaxone in ED with no apparent rxn.  Will continue with ceftriaxone.   Plan: Ceftriaxone 2 gm IV X 1 given in ED on 8/2 @ 0100.  Ceftriaxone 1 gm IV Q24H for 8/3 @ 0100 X 6 doses.   Height: 5\' 2"  (157.5 cm) Weight: 51.7 kg (114 lb) IBW/kg (Calculated) : 50.1  Temp (24hrs), Avg:99.3 F (37.4 C), Min:99.3 F (37.4 C), Max:99.3 F (37.4 C)  Recent Labs  Lab 11/27/21 2242 11/28/21 0035  WBC 15.5*  --   CREATININE 4.06*  --   LATICACIDVEN 1.3 1.2    Estimated Creatinine Clearance: 11.5 mL/min (A) (by C-G formula based on SCr of 4.06 mg/dL (H)).    Allergies  Allergen Reactions   Calcitonin Hives   Cephalosporins Other (See Comments)   Bee Venom Swelling    Severe swelling and hyperventilate    Cephalexin Other (See Comments)    bruising Pt states that this caused a blood disorder causing bruising Bleeding disorder with bruising    Adalimumab Rash    Antimicrobials this admission:   >>    >>   Dose adjustments this admission:   Microbiology results:  BCx:   UCx:    Sputum:    MRSA PCR:   Thank you for allowing pharmacy to be a part of this patient's care.  Loura Pitt D 11/28/2021 2:50 AM

## 2021-11-29 DIAGNOSIS — G9340 Encephalopathy, unspecified: Secondary | ICD-10-CM

## 2021-11-29 DIAGNOSIS — N12 Tubulo-interstitial nephritis, not specified as acute or chronic: Secondary | ICD-10-CM

## 2021-11-29 DIAGNOSIS — E871 Hypo-osmolality and hyponatremia: Secondary | ICD-10-CM

## 2021-11-29 DIAGNOSIS — L4050 Arthropathic psoriasis, unspecified: Secondary | ICD-10-CM | POA: Diagnosis not present

## 2021-11-29 DIAGNOSIS — N179 Acute kidney failure, unspecified: Secondary | ICD-10-CM | POA: Diagnosis not present

## 2021-11-29 DIAGNOSIS — E876 Hypokalemia: Secondary | ICD-10-CM

## 2021-11-29 DIAGNOSIS — N390 Urinary tract infection, site not specified: Secondary | ICD-10-CM | POA: Diagnosis not present

## 2021-11-29 DIAGNOSIS — R7881 Bacteremia: Secondary | ICD-10-CM

## 2021-11-29 DIAGNOSIS — A4150 Gram-negative sepsis, unspecified: Secondary | ICD-10-CM | POA: Diagnosis not present

## 2021-11-29 LAB — CBC
HCT: 28.4 % — ABNORMAL LOW (ref 36.0–46.0)
Hemoglobin: 10 g/dL — ABNORMAL LOW (ref 12.0–15.0)
MCH: 34.5 pg — ABNORMAL HIGH (ref 26.0–34.0)
MCHC: 35.2 g/dL (ref 30.0–36.0)
MCV: 97.9 fL (ref 80.0–100.0)
Platelets: 122 10*3/uL — ABNORMAL LOW (ref 150–400)
RBC: 2.9 MIL/uL — ABNORMAL LOW (ref 3.87–5.11)
RDW: 11.7 % (ref 11.5–15.5)
WBC: 9.2 10*3/uL (ref 4.0–10.5)
nRBC: 0.2 % (ref 0.0–0.2)

## 2021-11-29 LAB — BASIC METABOLIC PANEL
Anion gap: 10 (ref 5–15)
BUN: 28 mg/dL — ABNORMAL HIGH (ref 8–23)
CO2: 18 mmol/L — ABNORMAL LOW (ref 22–32)
Calcium: 7.2 mg/dL — ABNORMAL LOW (ref 8.9–10.3)
Chloride: 101 mmol/L (ref 98–111)
Creatinine, Ser: 1.61 mg/dL — ABNORMAL HIGH (ref 0.44–1.00)
GFR, Estimated: 36 mL/min — ABNORMAL LOW (ref >60)
Glucose, Bld: 112 mg/dL — ABNORMAL HIGH (ref 70–99)
Potassium: 3.1 mmol/L — ABNORMAL LOW (ref 3.5–5.1)
Sodium: 129 mmol/L — ABNORMAL LOW (ref 135–145)

## 2021-11-29 LAB — PROCALCITONIN: Procalcitonin: 18.18 ng/mL

## 2021-11-29 MED ORDER — FAMOTIDINE 20 MG PO TABS
20.0000 mg | ORAL_TABLET | Freq: Every day | ORAL | Status: DC
Start: 2021-11-29 — End: 2021-12-02
  Administered 2021-11-29 – 2021-12-02 (×4): 20 mg via ORAL
  Filled 2021-11-29 (×4): qty 1

## 2021-11-29 MED ORDER — CALCIUM CARBONATE ANTACID 500 MG PO CHEW
1.0000 | CHEWABLE_TABLET | Freq: Two times a day (BID) | ORAL | Status: DC
  Administered 2021-11-29 – 2021-12-02 (×7): 200 mg via ORAL
  Filled 2021-11-29 (×7): qty 1

## 2021-11-29 MED ORDER — RISAQUAD PO CAPS
1.0000 | ORAL_CAPSULE | Freq: Every day | ORAL | Status: DC
  Administered 2021-11-29 – 2021-12-02 (×4): 1 via ORAL
  Filled 2021-11-29 (×4): qty 1

## 2021-11-29 MED ORDER — SODIUM CHLORIDE 0.9 % IV SOLN
1.0000 g | INTRAVENOUS | Status: DC
  Administered 2021-11-29: 1000 mg via INTRAVENOUS
  Filled 2021-11-29 (×2): qty 1

## 2021-11-29 MED ORDER — POTASSIUM CHLORIDE CRYS ER 20 MEQ PO TBCR
40.0000 meq | EXTENDED_RELEASE_TABLET | Freq: Once | ORAL | Status: AC
  Administered 2021-11-29: 40 meq via ORAL
  Filled 2021-11-29: qty 2

## 2021-11-29 NOTE — Consult Note (Signed)
NAME: Jane Sanchez  DOB: Mar 08, 1961  MRN: [id number]  Date/Time: 11/29/2021 1:21 PM  REQUESTING PROVIDER; Dr.Amery Subjective:  REASON FOR CONSULT: ecoli bacteremia ? Jane Sanchez is a 61 y.o.female  with a history of Psoriatic arthritis , on cimtiza, osteoporosis on prolia presents with altered mental status on Tthe night on 11/27/21  As per patient she was okay on Monday and Tuesday morning- she has chronic back pain due to DDD with and is getting PT for that. On Tuesday she was found by her son to be lying half in bed and half outside very confused and he called EMS Pt does not remember that she had fever but as per note son had told triage that she was c/o fever and fatigue the day before. Also she reported having urinary hesitancy, frequency but she denies those symptoms to me   11/27/21  BP 103/91 (H)  Temp 99.3 F (37.4 C)  Pulse Rate 135 !  Resp 19  SpO2 100 %  Weight 114 lb    Latest Reference Range & Units 11/27/21  WBC 4.0 - 10.5 K/uL 15.5 (H)  Hemoglobin 12.0 - 15.0 g/dL 44.9  HCT 67.5 - 91.6 % 34.6 (L)  Platelets 150 - 400 K/uL 173  Creatinine 0.44 - 1.00 mg/dL 3.84 (H)  Na 665, UA > 50 wbc- blood and urine culture sent and she was started on ceftriaxone As per ED note she was awake, oriented to self , year, location and situation Ct abdomen showed bladder wall thickening, no hydronephrosis, large esophageal hiatal hernia, mulitple endplate compression defromities Cxr mild interstitial edema CT head N I am asked to see her because of ecoli bacteremia Pt is doing much better Se says she went and had a shower He has had PA for many years- She used to be on remicade for nealy 11 years and it worked the best- she has now been on Cimzia a TNF blocker She gets it every 2 weeks  Past Medical History:  Diagnosis Date   Arthritis    Asthma    Hyperlipidemia    Hypertension    Osteoporosis     Past Surgical History:  Procedure Laterality Date   BREAST BIOPSY  Bilateral    benign cysts   CESAREAN SECTION  1986   1989, 1991   KNEE SURGERY Left 1983   MENISCECTOMY Right 2011   TOTAL ABDOMINAL HYSTERECTOMY  2001   removal of cervix as well    Social History   Socioeconomic History   Marital status: Divorced    Spouse name: Not on file   Number of children: Not on file   Years of education: Not on file   Highest education level: Not on file  Occupational History   Not on file  Tobacco Use   Smoking status: Never   Smokeless tobacco: Never  Substance and Sexual Activity   Alcohol use: Yes    Alcohol/week: 2.0 standard drinks of alcohol    Types: 2 Glasses of wine per week   Drug use: Not on file   Sexual activity: Not on file  Other Topics Concern   Not on file  Social History Narrative   Not on file   Social Determinants of Health   Financial Resource Strain: Not on file  Food Insecurity: Not on file  Transportation Needs: Not on file  Physical Activity: Not on file  Stress: Not on file  Social Connections: Not on file  Intimate Partner Violence: Not on file  Family History  Problem Relation Age of Onset   Breast cancer Mother    Bladder Cancer Mother    Breast cancer Maternal Aunt    Allergies  Allergen Reactions   Calcitonin Hives   Cephalosporins Other (See Comments)   Bee Venom Swelling    Severe swelling and hyperventilate    Cephalexin Other (See Comments)    bruising Pt states that this caused a blood disorder causing bruising Bleeding disorder with bruising    Adalimumab Rash   I? Current Facility-Administered Medications  Medication Dose Route Frequency Provider Last Rate Last Admin   acetaminophen (TYLENOL) tablet 650 mg  650 mg Oral Q6H PRN Mansy, Jan A, MD   650 mg at 11/28/21 2029   Or   acetaminophen (TYLENOL) suppository 650 mg  650 mg Rectal Q6H PRN Mansy, Jan A, MD       acidophilus (RISAQUAD) capsule 1 capsule  1 capsule Oral Daily Lynn Ito, MD       albuterol (PROVENTIL) (2.5  MG/3ML) 0.083% nebulizer solution 2.5 mg  2.5 mg Nebulization Q6H PRN Mansy, Jan A, MD       cefTRIAXone (ROCEPHIN) 2 g in sodium chloride 0.9 % 100 mL IVPB  2 g Intravenous Q24H Lynn Ito, MD 200 mL/hr at 11/28/21 2229 2 g at 11/28/21 2229   cyclobenzaprine (FLEXERIL) tablet 5-10 mg  5-10 mg Oral BID PRN Mansy, Jan A, MD   5 mg at 11/28/21 1654   estradiol (ESTRACE) tablet 0.5 mg  0.5 mg Oral Daily Mansy, Jan A, MD   0.5 mg at 11/29/21 1015   gabapentin (NEURONTIN) capsule 100 mg  100 mg Oral TID Lynn Ito, MD   100 mg at 11/29/21 1015   heparin injection 5,000 Units  5,000 Units Subcutaneous Q8H Mansy, Jan A, MD   5,000 Units at 11/29/21 5284   magnesium hydroxide (MILK OF MAGNESIA) suspension 30 mL  30 mL Oral Daily PRN Mansy, Jan A, MD       morphine (PF) 2 MG/ML injection 2 mg  2 mg Intravenous Q4H PRN Mansy, Jan A, MD       ondansetron [facility]) tablet 4 mg  4 mg Oral Q6H PRN Mansy, Jan A, MD       Or   ondansetron [facility]) injection 4 mg  4 mg Intravenous Q6H PRN Mansy, Jan A, MD       potassium chloride SA (KLOR-CON M) CR tablet 40 mEq  40 mEq Oral Once Lynn Ito, MD       rosuvastatin (CRESTOR) tablet 10 mg  10 mg Oral Once per day on Mon Wed Fri Mansy, Jan A, MD   10 mg at 11/28/21 1324   traZODone (DESYREL) tablet 25 mg  25 mg Oral QHS PRN Mansy, Jan A, MD   25 mg at 11/28/21 2229   valACYclovir (VALTREX) tablet 500 mg  500 mg Oral Daily Mansy, Jan A, MD   500 mg at 11/29/21 1015     Abtx:  Anti-infectives (From admission, onward)    Start     Dose/Rate Route Frequency Ordered Stop   11/28/21 2200  cefTRIAXone (ROCEPHIN) 1 g in sodium chloride 0.9 % 100 mL IVPB  Status:  Discontinued        1 g 200 mL/hr over 30 Minutes Intravenous Every 24 hours 11/28/21 0249 11/28/21 1300   11/28/21 2200  cefTRIAXone (ROCEPHIN) 2 g in sodium chloride 0.9 % 100 mL IVPB        2 g  200 mL/hr over 30 Minutes Intravenous Every 24 hours 11/28/21 1300 12/04/21 2159   11/28/21 1000   valACYclovir (VALTREX) tablet 500 mg       Note to Pharmacy: For suppression     500 mg Oral Daily 11/28/21 0243     11/28/21 0245  levofloxacin (LEVAQUIN) IVPB 750 mg  Status:  Discontinued        750 mg 100 mL/hr over 90 Minutes Intravenous  Once 11/28/21 0243 11/28/21 0253   11/28/21 0045  cefTRIAXone (ROCEPHIN) 2 g in sodium chloride 0.9 % 100 mL IVPB  Status:  Discontinued        2 g 200 mL/hr over 30 Minutes Intravenous Every 24 hours 11/28/21 0044 11/28/21 0243       REVIEW OF SYSTEMS:  Const: neg fever, negative chills, negative weight loss Eyes: negative diplopia or visual changes, negative eye pain ENT: negative coryza, negative sore throat Resp: negative cough, hemoptysis, dyspnea Cards: negative for chest pain, palpitations, lower extremity edema GU: negative for frequency, dysuria and hematuria GI: Negative for abdominal pain, diarrhea, bleeding, constipation Skin: negative for rash and pruritus Heme: negative for easy bruising and gum/nose bleeding MS: always has low back pain and mid back pain Neurolo:confusion Psych: negative for feelings of anxiety, depression  Endocrine: negative for thyroid, diabetes Allergy/Immunology- says keflex caused bruising many years ago ( 73yrs ago? : Objective:  VITALS:  BP 120/77 (BP Location: Left Arm)   Pulse 90   Temp 99.1 F (37.3 C)   Resp 18   Ht 5\' 2"  (1.575 m)   Wt 51.7 kg   SpO2 100%   BMI 20.85 kg/m   PHYSICAL EXAM:  General: Alert, cooperative, no distress, . Thin built Head: Normocephalic, without obvious abnormality, atraumatic. Eyes: Conjunctivae clear, anicteric sclerae. Pupils are equal ENT Nares normal. No drainage or sinus tenderness. Lips, mucosa, and tongue normal. No Thrush Neck: Supple, symmetrical, no adenopathy, thyroid: non tender no carotid bruit and no JVD. Back: No CVA tenderness. Lungs: Clear to auscultation bilaterally. No Wheezing or Rhonchi. No rales. Heart: Regular rate and rhythm, no  murmur, rub or gallop. Abdomen: Soft, non-tender,not distended. Bowel sounds normal. No masses Extremities: atraumatic, no cyanosis. No edema. No clubbing Skin: No rashes or lesions. Or bruising Lymph: Cervical, supraclavicular normal. Neurologic: Grossly non-focal Pertinent Labs Lab Results CBC     Latest Ref Rng & Units 11/29/2021   10:19 AM 11/28/2021    4:05 AM 11/27/2021   10:42 PM  CBC  WBC 4.0 - 10.5 K/uL 9.2  14.6  15.5   Hemoglobin 12.0 - 15.0 g/dL 01/27/2022  67.5  91.6   Hematocrit 36.0 - 46.0 % 28.4  31.6  34.6   Platelets 150 - 400 K/uL 122  142  173        Latest Ref Rng & Units 11/29/2021   10:19 AM 11/28/2021    4:05 AM 11/27/2021   10:42 PM  CMP  Glucose 70 - 99 mg/dL 01/27/2022  83  665   BUN 8 - 23 mg/dL 28  49  53   Creatinine 0.44 - 1.00 mg/dL 993  5.70  1.77   Sodium 135 - 145 mmol/L 129  127  122   Potassium 3.5 - 5.1 mmol/L 3.1  4.3  4.8   Chloride 98 - 111 mmol/L 101  100  89   CO2 22 - 32 mmol/L 18  16  19    Calcium 8.9 - 10.3 mg/dL 7.2  6.8  7.8  Total Protein 6.5 - 8.1 g/dL   7.1   Total Bilirubin 0.3 - 1.2 mg/dL   0.9   Alkaline Phos 38 - 126 U/L   125   AST 15 - 41 U/L   28   ALT 0 - 44 U/L   19       Microbiology: Recent Results (from the past 240 hour(s))  Culture, blood (Routine x 2)     Status: None (Preliminary result)   Collection Time: 11/27/21 10:43 PM   Specimen: BLOOD  Result Value Ref Range Status   Specimen Description   Final    BLOOD RIGHT ASSIST CONTROL Performed at [facility], [address] [place], [place] [address]    Special Requests   Final    BOTTLES DRAWN AEROBIC AND ANAEROBIC Blood Culture adequate volume Performed at [facility], [address] [place], [place] [address]    Culture  Setup Time   Final    Organism ID to follow GRAM NEGATIVE RODS ANAEROBIC BOTTLE ONLY CRITICAL RESULT CALLED TO, READ BACK BY AND VERIFIED WITH: JASON ROBBINS @ 2319 ON 11/28/2021.Marland KitchenMarland KitchenTKR    Culture GRAM NEGATIVE RODS   Final   Report Status PENDING  Incomplete  Urine Culture     Status: Abnormal (Preliminary result)   Collection Time: 11/27/21 10:43 PM   Specimen: Urine, Random  Result Value Ref Range Status   Specimen Description   Final    URINE, RANDOM Performed at [facility], [address] [place], [place] [address]    Special Requests   Final    NONE Performed at [facility], [address], [place], [place] [address]    Culture (A)  Final    >=100,000 COLONIES/mL Romie Minus NEGATIVE RODS SUSCEPTIBILITIES TO FOLLOW Performed at [facility], [address], [place], [place] [address]    Report Status PENDING  Incomplete  Blood Culture ID Panel (Reflexed)     Status: Abnormal   Collection Time: 11/27/21 10:43 PM  Result Value Ref Range Status   Enterococcus faecalis NOT DETECTED NOT DETECTED Final   Enterococcus Faecium NOT DETECTED NOT DETECTED Final   Listeria monocytogenes NOT DETECTED NOT DETECTED Final   Staphylococcus species NOT DETECTED NOT DETECTED Final   Staphylococcus aureus (BCID) NOT DETECTED NOT DETECTED Final   Staphylococcus epidermidis NOT DETECTED NOT DETECTED Final   Staphylococcus lugdunensis NOT DETECTED NOT DETECTED Final   Streptococcus species NOT DETECTED NOT DETECTED Final   Streptococcus agalactiae NOT DETECTED NOT DETECTED Final   Streptococcus pneumoniae NOT DETECTED NOT DETECTED Final   Streptococcus pyogenes NOT DETECTED NOT DETECTED Final   A.calcoaceticus-baumannii NOT DETECTED NOT DETECTED Final   Bacteroides fragilis NOT DETECTED NOT DETECTED Final   Enterobacterales DETECTED (A) NOT DETECTED Final    Comment: Enterobacterales represent a large order of gram negative bacteria, not a single organism. CRITICAL RESULT CALLED TO, READ BACK BY AND VERIFIED WITH: JASON ROBBINS @ 2319 ON 11/28/2021.Marland KitchenMarland KitchenTKR    Enterobacter cloacae complex NOT DETECTED NOT DETECTED Final   Escherichia coli DETECTED (A) NOT DETECTED Final    Comment:  CRITICAL RESULT CALLED TO, READ BACK BY AND VERIFIED WITH: JASON ROBBINS @ 2319 ON 11/28/2021.Marland KitchenMarland KitchenTKR    Klebsiella aerogenes NOT DETECTED NOT DETECTED Final   Klebsiella oxytoca NOT DETECTED NOT DETECTED Final   Klebsiella pneumoniae NOT DETECTED NOT DETECTED Final   Proteus species NOT DETECTED NOT DETECTED Final   Salmonella species NOT DETECTED NOT DETECTED Final   Serratia marcescens  NOT DETECTED NOT DETECTED Final   Haemophilus influenzae NOT DETECTED NOT DETECTED Final   Neisseria meningitidis NOT DETECTED NOT DETECTED Final   Pseudomonas aeruginosa NOT DETECTED NOT DETECTED Final   Stenotrophomonas maltophilia NOT DETECTED NOT DETECTED Final   Candida albicans NOT DETECTED NOT DETECTED Final   Candida auris NOT DETECTED NOT DETECTED Final   Candida glabrata NOT DETECTED NOT DETECTED Final   Candida krusei NOT DETECTED NOT DETECTED Final   Candida parapsilosis NOT DETECTED NOT DETECTED Final   Candida tropicalis NOT DETECTED NOT DETECTED Final   Cryptococcus neoformans/gattii NOT DETECTED NOT DETECTED Final   CTX-M ESBL NOT DETECTED NOT DETECTED Final   Carbapenem resistance IMP NOT DETECTED NOT DETECTED Final   Carbapenem resistance KPC NOT DETECTED NOT DETECTED Final   Carbapenem resistance NDM NOT DETECTED NOT DETECTED Final   Carbapenem resist OXA 48 LIKE NOT DETECTED NOT DETECTED Final   Carbapenem resistance VIM NOT DETECTED NOT DETECTED Final    Comment: Performed at [facility], [address] [place], [place] [address]    IMAGING RESULTS: As above I have personally reviewed the films ? Impression/Recommendation Encephalopathy due to sepsis and AKI- resolved  Ecoli bacteremia and left pyelonephritis- currently on ceftriaxone -  Cephalexin  caused severe bruising wide spread 30 yrs ago--Avoid cephalosporins As platelet slowly declining will avoid class effect with cephalosporin ( even though sepsis could be contributing) and change to ertapenem Once  susceptibility back and quinolone susceptible would use that-  Can do 7-10 days - if cipro is chosen then end date  12/04/21  Minimal urinary symptoms - she is on TNF blocker and at risk for infections  AKI- no evidence of hydronephrosis- has bladder wall thickening- r/o incomplete bladder emptying Initial Bladder scan was 325ml Repeat  AKI- improving  Psoriatic arthritis on TNFblocker- hold till completion of antibiotic and resolution of infection  DDD  Osteoporosis with multiple vertebrae-endplate involvement - takes prolia as OP  Hysterectomy ( BSO?) on estradiol  HLD on rosuvastatin  ___________________________________________________ Discussed with patient,in great detail Discussed with requesting provider [facility] covering 8/4, 8/5 and 8/6 Dr.Snider available by phone for urgent issues Note:  This document was prepared using Dragon voice recognition software and may include unintentional dictation errors.

## 2021-11-29 NOTE — Progress Notes (Signed)
PROGRESS NOTE    Jane Sanchez  ITG:[id number] DOB: 05-06-1960 DOA: 11/28/2021 PCP: Smitty Cords, DO    Brief Narrative:  Jane Sanchez is a 61 y.o. female with medical history significant for asthma, hypertension, dyslipidemia, osteoporosis and osteoarthritis, presented to the emergency room with acute onset of altered mental status with confusion.  The patient was fairly repetitive per her son and admits to having recent dysuria with associated urinary frequency, urgency and left flank pain.  She has been having fever and chills.  Her Tmax was 10 2-1 03 per her report.  No chest pain or dyspnea or cough.  No headache or dizziness or blurred vision.  She felt overall as if she was having the flu per her report.   ED Course: When she came to the ER temperature was 99.3 and blood pressure was 103/91 with a heart rate of 135.  Labs revealed hyponatremia 122 and hypochloremia of 89 with a CO2 of 19 blood glucose 106 and BUN 53 with a creatinine of 4.06 previously normal in January of this year, calcium 7.8 and albumin 2.9 with otherwise unremarkable LFTs.  Lactic acid was 1.3 and later 1.2 procalcitonin level was 74 point CBC showed leukocytosis 15.5 with neutrophilia and bandemia INR was 1.2 and PT 14.8   CT renal stone study revealed the following: 1. Stranding around the left kidney and ureter. No stone or hydronephrosis. Changes likely to represent pyelonephritis   8/3 blood culture positive for E. coli/Enterobacter. Febrile last night and today.    Consultants:    Procedures:   Antimicrobials:  rocephin    Subjective: Feels better. No abd pain  Objective: Vitals:   11/29/21 0002 11/29/21 0436 11/29/21 0436 11/29/21 1131  BP: 101/70 124/68 124/68 120/77  Pulse: 79 87 87 90  Resp:  16 16 18   Temp: 98.6 F (37 C) (!) 100.5 F (38.1 C) (!) 100.5 F (38.1 C) 99.1 F (37.3 C)  TempSrc:  Oral    SpO2: 99% 100% 100% 100%  Weight:      Height:         Intake/Output Summary (Last 24 hours) at 11/29/2021 1226 Last data filed at 11/29/2021 0600 Gross per 24 hour  Intake 100.69 ml  Output --  Net 100.69 ml   Filed Weights   11/27/21 2236  Weight: 51.7 kg    Examination: Calm, NAD Cta no w/r Reg s1/s2 no gallop Soft benign +bs No edema Aaoxox3  Mood and affect appropriate in current setting     Data Reviewed: I have personally reviewed following labs and imaging studies  CBC: Recent Labs  Lab 11/27/21 2242 11/28/21 0405 11/29/21 1019  WBC 15.5* 14.6* 9.2  NEUTROABS 13.6*  --   --   HGB 12.4 10.9* 10.0*  HCT 34.6* 31.6* 28.4*  MCV 98.9 100.3* 97.9  PLT 173 142* 122*   Basic Metabolic Panel: Recent Labs  Lab 11/27/21 2242 11/28/21 0405 11/29/21 1019  NA 122* 127* 129*  K 4.8 4.3 3.1*  CL 89* 100 101  CO2 19* 16* 18*  GLUCOSE 105* 83 112*  BUN 53* 49* 28*  CREATININE 4.06* 3.40* 1.61*  CALCIUM 7.8* 6.8* 7.2*   GFR: Estimated Creatinine Clearance: 29 mL/min (A) (by C-G formula based on SCr of 1.61 mg/dL (H)). Liver Function Tests: Recent Labs  Lab 11/27/21 2242  AST 28  ALT 19  ALKPHOS 125  BILITOT 0.9  PROT 7.1  ALBUMIN 2.9*   No results for input(s): "LIPASE", "AMYLASE" in  the last 168 hours. No results for input(s): "AMMONIA" in the last 168 hours. Coagulation Profile: Recent Labs  Lab 11/27/21 2242 11/28/21 0405  INR 1.2 1.3*   Cardiac Enzymes: No results for input(s): "CKTOTAL", "CKMB", "CKMBINDEX", "TROPONINI" in the last 168 hours. BNP (last 3 results) No results for input(s): "PROBNP" in the last 8760 hours. HbA1C: No results for input(s): "HGBA1C" in the last 72 hours. CBG: No results for input(s): "GLUCAP" in the last 168 hours. Lipid Profile: No results for input(s): "CHOL", "HDL", "LDLCALC", "TRIG", "CHOLHDL", "LDLDIRECT" in the last 72 hours. Thyroid Function Tests: No results for input(s): "TSH", "T4TOTAL", "FREET4", "T3FREE", "THYROIDAB" in the last 72 hours. Anemia  Panel: No results for input(s): "VITAMINB12", "FOLATE", "FERRITIN", "TIBC", "IRON", "RETICCTPCT" in the last 72 hours. Sepsis Labs: Recent Labs  Lab 11/27/21 2242 11/28/21 0035 11/28/21 0405 11/29/21 0604  PROCALCITON 74.11  --  48.89 18.18  LATICACIDVEN 1.3 1.2  --   --     Recent Results (from the past 240 hour(s))  Culture, blood (Routine x 2)     Status: None (Preliminary result)   Collection Time: 11/27/21 10:43 PM   Specimen: BLOOD  Result Value Ref Range Status   Specimen Description   Final    BLOOD RIGHT ASSIST CONTROL Performed at [facility], [address] [place], [place] [address]    Special Requests   Final    BOTTLES DRAWN AEROBIC AND ANAEROBIC Blood Culture adequate volume Performed at [facility], [address] [place], [place] [address]    Culture  Setup Time   Final    Organism ID to follow GRAM NEGATIVE RODS ANAEROBIC BOTTLE ONLY CRITICAL RESULT CALLED TO, READ BACK BY AND VERIFIED WITH: JASON ROBBINS @ 2319 ON 11/28/2021.Marland Kitchen.Marland Kitchen.TKR    Culture GRAM NEGATIVE RODS  Final   Report Status PENDING  Incomplete  Urine Culture     Status: Abnormal (Preliminary result)   Collection Time: 11/27/21 10:43 PM   Specimen: Urine, Random  Result Value Ref Range Status   Specimen Description   Final    URINE, RANDOM Performed at [facility], [address] [place], [place] [address]    Special Requests   Final    NONE Performed at [facility], [address] [place], [place] [address]    Culture (A)  Final    >=100,000 COLONIES/mL Romie MinusGRAM NEGATIVE RODS SUSCEPTIBILITIES TO FOLLOW Performed at [facility], [address] [place], [place] [address]    Report Status PENDING  Incomplete  Blood Culture ID Panel (Reflexed)     Status: Abnormal   Collection Time: 11/27/21 10:43 PM  Result Value Ref Range Status   Enterococcus faecalis NOT DETECTED NOT DETECTED Final   Enterococcus Faecium NOT DETECTED NOT DETECTED  Final   Listeria monocytogenes NOT DETECTED NOT DETECTED Final   Staphylococcus species NOT DETECTED NOT DETECTED Final   Staphylococcus aureus (BCID) NOT DETECTED NOT DETECTED Final   Staphylococcus epidermidis NOT DETECTED NOT DETECTED Final   Staphylococcus lugdunensis NOT DETECTED NOT DETECTED Final   Streptococcus species NOT DETECTED NOT DETECTED Final   Streptococcus agalactiae NOT DETECTED NOT DETECTED Final   Streptococcus pneumoniae NOT DETECTED NOT DETECTED Final   Streptococcus pyogenes NOT DETECTED NOT DETECTED Final   A.calcoaceticus-baumannii NOT DETECTED NOT DETECTED Final   Bacteroides fragilis NOT DETECTED NOT DETECTED Final   Enterobacterales DETECTED (A) NOT DETECTED Final    Comment: Enterobacterales represent a large order of gram negative bacteria, not  a single organism. CRITICAL RESULT CALLED TO, READ BACK BY AND VERIFIED WITH: JASON ROBBINS @ 2319 ON 11/28/2021.Marland KitchenMarland KitchenTKR    Enterobacter cloacae complex NOT DETECTED NOT DETECTED Final   Escherichia coli DETECTED (A) NOT DETECTED Final    Comment: CRITICAL RESULT CALLED TO, READ BACK BY AND VERIFIED WITH: JASON ROBBINS @ 2319 ON 11/28/2021.Marland KitchenMarland KitchenTKR    Klebsiella aerogenes NOT DETECTED NOT DETECTED Final   Klebsiella oxytoca NOT DETECTED NOT DETECTED Final   Klebsiella pneumoniae NOT DETECTED NOT DETECTED Final   Proteus species NOT DETECTED NOT DETECTED Final   Salmonella species NOT DETECTED NOT DETECTED Final   Serratia marcescens NOT DETECTED NOT DETECTED Final   Haemophilus influenzae NOT DETECTED NOT DETECTED Final   Neisseria meningitidis NOT DETECTED NOT DETECTED Final   Pseudomonas aeruginosa NOT DETECTED NOT DETECTED Final   Stenotrophomonas maltophilia NOT DETECTED NOT DETECTED Final   Candida albicans NOT DETECTED NOT DETECTED Final   Candida auris NOT DETECTED NOT DETECTED Final   Candida glabrata NOT DETECTED NOT DETECTED Final   Candida krusei NOT DETECTED NOT DETECTED Final   Candida parapsilosis NOT  DETECTED NOT DETECTED Final   Candida tropicalis NOT DETECTED NOT DETECTED Final   Cryptococcus neoformans/gattii NOT DETECTED NOT DETECTED Final   CTX-M ESBL NOT DETECTED NOT DETECTED Final   Carbapenem resistance IMP NOT DETECTED NOT DETECTED Final   Carbapenem resistance KPC NOT DETECTED NOT DETECTED Final   Carbapenem resistance NDM NOT DETECTED NOT DETECTED Final   Carbapenem resist OXA 48 LIKE NOT DETECTED NOT DETECTED Final   Carbapenem resistance VIM NOT DETECTED NOT DETECTED Final    Comment: Performed at [facility], [address] [place], [place] [address]         Radiology Studies: CT HEAD WO CONTRAST ( )  Result Date: 11/28/2021 CLINICAL DATA:  Altered mental status. EXAM: CT HEAD WITHOUT CONTRAST TECHNIQUE: Contiguous axial images were obtained from the base of the skull through the vertex without intravenous contrast. RADIATION DOSE REDUCTION: This exam was performed according to the departmental dose-optimization program which includes automated exposure control, adjustment of the mA and/or kV according to patient size and/or use of iterative reconstruction technique. COMPARISON:  None Available. FINDINGS: Brain: The ventricles and sulci are appropriate size for the patient's age. The gray-white matter discrimination is preserved. There is no acute intracranial hemorrhage. No mass effect or midline shift. No extra-axial fluid collection. Vascular: No hyperdense vessel or unexpected calcification. Skull: Normal. Negative for fracture or focal lesion. Sinuses/Orbits: Mild mucoperiosteal thickening of paranasal sinuses with opacification of several ethmoid air cells. No air-fluid. The mid of the visualized paranasal sinuses and mastoid air cells are clear. Other: None IMPRESSION: No acute intracranial pathology. Electronically Signed   By: Elgie Collard M.D.   On: 11/28/2021 01:57   CT Renal Stone Study  Result Date: 11/28/2021 CLINICAL DATA:  Sepsis, urinary tract  infection, right CVA tenderness. EXAM: CT ABDOMEN AND PELVIS WITHOUT CONTRAST TECHNIQUE: Multidetector CT imaging of the abdomen and pelvis was performed following the standard protocol without IV contrast. RADIATION DOSE REDUCTION: This exam was performed according to the departmental dose-optimization program which includes automated exposure control, adjustment of the mA and/or kV according to patient size and/or use of iterative reconstruction technique. COMPARISON:  None Available. FINDINGS: Lower chest: Lung bases are clear. Large esophageal hiatal hernia behind the heart. Hepatobiliary: No focal liver abnormality is seen. No gallstones, gallbladder wall thickening, or biliary dilatation. Pancreas: Unremarkable. No pancreatic ductal dilatation or surrounding inflammatory changes.  Spleen: Normal in size without focal abnormality. Adrenals/Urinary Tract: No adrenal gland nodules. Stranding around the left kidney and ureter. No hydronephrosis or hydroureter. No stones are identified. Changes likely to represent pyelonephritis although this could also indicate sequela from a recently passed stone in the appropriate clinical setting. Bladder wall is thickened, suggesting cystitis. Right kidney is normal. Stomach/Bowel: Stomach is within normal limits. Appendix is not identified. No evidence of bowel wall thickening, distention, or inflammatory changes. Vascular/Lymphatic: Aortic atherosclerosis. No enlarged abdominal or pelvic lymph nodes. Reproductive: Status post hysterectomy. No adnexal masses. Other: No abdominal wall hernia or abnormality. No abdominopelvic ascites. Musculoskeletal: Degenerative changes. Mild lumbar scoliosis convex towards the left. Multiple endplate compression deformities in the lumbar spine, likely indicating osteoporosis. IMPRESSION: 1. Stranding around the left kidney and ureter. No stone or hydronephrosis. Changes likely to represent pyelonephritis. 2. Bladder wall thickening suggesting  cystitis. 3. Large esophageal hiatal hernia. 4. Aortic atherosclerosis. 5. Multiple endplate compression deformities in the lumbar spine suggesting osteoporosis. Electronically Signed   By: Burman Nieves M.D.   On: 11/28/2021 01:56   DG Chest Port 1 View  Result Date: 11/27/2021 CLINICAL DATA:  Fever and fatigue. EXAM: PORTABLE CHEST 1 VIEW COMPARISON:  None Available. FINDINGS: Mild diffuse interstitial prominence may represent mild edema. No focal consolidation, pleural effusion or pneumothorax. The cardiac silhouette is within limits. Small hiatal hernia. No acute osseous pathology. IMPRESSION: Mild diffuse interstitial prominence may represent mild edema. No focal consolidation. Electronically Signed   By: Elgie Collard M.D.   On: 11/27/2021 22:50        Scheduled Meds:  acidophilus  1 capsule Oral Daily   estradiol  0.5 mg Oral Daily   gabapentin  100 mg Oral TID   heparin injection (subcutaneous)  5,000 Units Subcutaneous Q8H   potassium chloride  40 mEq Oral Once   rosuvastatin  10 mg Oral Once per day on Mon Wed Fri   valACYclovir  500 mg Oral Daily   Continuous Infusions:  cefTRIAXone (ROCEPHIN)  IV 2 g (11/28/21 2229)    Assessment & Plan:   Principal Problem:   Sepsis due to gram-negative UTI (HCC) Active Problems:   Severe sepsis (HCC)   AKI (acute kidney injury) (HCC)   Psoriatic arthritis (HCC)   Essential hypertension   Asthma, chronic   Dyslipidemia   Pyelonephritis   Bacteremia   Hyponatremia   Hypokalemia   Severe Sepsis  due to gram-negative UTI (HCC)  and bacteremia Acute pyelonephritis Urine culture positive for gram-negative rods Blood culture positive for E. Coli Still spiking fevers Continue iv rocephin ID consult Follow-up sensitivity of cultures Add probiotics   E.Coli Bacteremia Likely from urine Continue IV Rocephin Follow-up sensitivities ID consulted   AKI (acute kidney injury) (HCC) Prerenal Improving with IV  fluids Continue to monitor  Hyponatremia Due to hypovolemia Improving with IV fluids   Hypokalemia We will replace with Kcl Monitor   Dyslipidemia Continue statins   Asthma, chronic No acute exacerbation Continue inhalers   Essential hypertension Hold bp meds   Psoriatic arthritis (HCC) - The patient is on apremilast and Remicade. Hold for now         DVT prophylaxis: heparin Code Status:full Family Communication: son at bedside Disposition Plan: back home Status is: Inpatient Remains inpatient appropriate because: IV treatment , still with fevers, bactermia      LOS: 1 day   Time spent:    Lynn Ito, MD [facility] Pager [phone]  If  7PM-7AM, please contact night-coverage 11/29/2021, 12:26 PM

## 2021-11-30 DIAGNOSIS — N390 Urinary tract infection, site not specified: Secondary | ICD-10-CM | POA: Diagnosis not present

## 2021-11-30 DIAGNOSIS — A4150 Gram-negative sepsis, unspecified: Secondary | ICD-10-CM | POA: Diagnosis not present

## 2021-11-30 LAB — BASIC METABOLIC PANEL
Anion gap: 7 (ref 5–15)
BUN: 18 mg/dL (ref 8–23)
CO2: 17 mmol/L — ABNORMAL LOW (ref 22–32)
Calcium: 7.4 mg/dL — ABNORMAL LOW (ref 8.9–10.3)
Chloride: 103 mmol/L (ref 98–111)
Creatinine, Ser: 1.03 mg/dL — ABNORMAL HIGH (ref 0.44–1.00)
GFR, Estimated: >60 mL/min (ref >60)
Glucose, Bld: 88 mg/dL (ref 70–99)
Potassium: 3.6 mmol/L (ref 3.5–5.1)
Sodium: 127 mmol/L — ABNORMAL LOW (ref 135–145)

## 2021-11-30 LAB — CBC
HCT: 25.4 % — ABNORMAL LOW (ref 36.0–46.0)
Hemoglobin: 9.1 g/dL — ABNORMAL LOW (ref 12.0–15.0)
MCH: 34.3 pg — ABNORMAL HIGH (ref 26.0–34.0)
MCHC: 35.8 g/dL (ref 30.0–36.0)
MCV: 95.8 fL (ref 80.0–100.0)
Platelets: 124 10*3/uL — ABNORMAL LOW (ref 150–400)
RBC: 2.65 MIL/uL — ABNORMAL LOW (ref 3.87–5.11)
RDW: 11.6 % (ref 11.5–15.5)
WBC: 8 10*3/uL (ref 4.0–10.5)
nRBC: 0 % (ref 0.0–0.2)

## 2021-11-30 LAB — URINE CULTURE: Culture: >=100000 — AB

## 2021-11-30 LAB — PROCALCITONIN: Procalcitonin: 8.07 ng/mL

## 2021-11-30 MED ORDER — CIPROFLOXACIN IN D5W 400 MG/200ML IV SOLN
400.0000 mg | Freq: Two times a day (BID) | INTRAVENOUS | Status: DC
  Administered 2021-11-30 – 2021-12-02 (×4): 400 mg via INTRAVENOUS
  Filled 2021-11-30 (×5): qty 200

## 2021-11-30 NOTE — Progress Notes (Signed)
PROGRESS NOTE    Jane Sanchez  [id number] DOB: July 23, 1960 DOA: 11/28/2021 PCP: Olin Hauser, DO    Brief Narrative:  Jane Sanchez is a 61 y.o. female with medical history significant for asthma, hypertension, dyslipidemia, osteoporosis and osteoarthritis, presented to the emergency room with acute onset of altered mental status with confusion.  The patient was fairly repetitive per her son and admits to having recent dysuria with associated urinary frequency, urgency and left flank pain.  She has been having fever and chills.  Her Tmax was 10 2-1 03 per her report.  No chest pain or dyspnea or cough.  No headache or dizziness or blurred vision.  She felt overall as if she was having the flu per her report.   ED Course: When she came to the ER temperature was 99.3 and blood pressure was 103/91 with a heart rate of 135.  Labs revealed hyponatremia 122 and hypochloremia of 89 with a CO2 of 19 blood glucose 106 and BUN 53 with a creatinine of 4.06 previously normal in January of this year, calcium 7.8 and albumin 2.9 with otherwise unremarkable LFTs.  Lactic acid was 1.3 and later 1.2 procalcitonin level was 74 point CBC showed leukocytosis 15.5 with neutrophilia and bandemia INR was 1.2 and PT 14.8   CT renal stone study revealed the following: 1. Stranding around the left kidney and ureter. No stone or hydronephrosis. Changes likely to represent pyelonephritis   8/3 blood culture positive for E. coli/Enterobacter. Febrile last night and today.  8/4 febrile this am.   Consultants:  ID  Procedures:   Antimicrobials:  rocephin    Subjective: Feeling better, just with flank soarness today.  Objective: Vitals:   11/29/21 2059 11/29/21 2215 11/30/21 0548 11/30/21 0743  BP: 139/81 127/71 137/84 120/67  Pulse: 88 84 97 92  Resp: 16 18 18 19   Temp: 100 F (37.8 C) 98.1 F (36.7 C) (!) 100.4 F (38 C) 98.8 F (37.1 C)  TempSrc:      SpO2: 100% 99% 96% 97%   Weight:      Height:       No intake or output data in the 24 hours ending 11/30/21 0825  Filed Weights   11/27/21 2236  Weight: 51.7 kg    Examination: Calm, NAD Cta no w/r Reg s1/s2 no gallop Soft benign +bs No edema Aaoxox3  Mood and affect appropriate in current setting     Data Reviewed: I have personally reviewed following labs and imaging studies  CBC: Recent Labs  Lab 11/27/21 2242 11/28/21 0405 11/29/21 1019  WBC 15.5* 14.6* 9.2  NEUTROABS 13.6*  --   --   HGB 12.4 10.9* 10.0*  HCT 34.6* 31.6* 28.4*  MCV 98.9 100.3* 97.9  PLT 173 142* 123XX123*   Basic Metabolic Panel: Recent Labs  Lab 11/27/21 2242 11/28/21 0405 11/29/21 1019 11/30/21 0605  NA 122* 127* 129* 127*  K 4.8 4.3 3.1* 3.6  CL 89* 100 101 103  CO2 19* 16* 18* 17*  GLUCOSE 105* 83 112* 88  BUN 53* 49* 28* 18  CREATININE 4.06* 3.40* 1.61* 1.03*  CALCIUM 7.8* 6.8* 7.2* 7.4*   GFR: Estimated Creatinine Clearance: 45.4 mL/min (A) (by C-G formula based on SCr of 1.03 mg/dL (H)). Liver Function Tests: Recent Labs  Lab 11/27/21 2242  AST 28  ALT 19  ALKPHOS 125  BILITOT 0.9  PROT 7.1  ALBUMIN 2.9*   No results for input(s): "LIPASE", "AMYLASE" in the last 168 hours.  No results for input(s): "AMMONIA" in the last 168 hours. Coagulation Profile: Recent Labs  Lab 11/27/21 2242 11/28/21 0405  INR 1.2 1.3*   Cardiac Enzymes: No results for input(s): "CKTOTAL", "CKMB", "CKMBINDEX", "TROPONINI" in the last 168 hours. BNP (last 3 results) No results for input(s): "PROBNP" in the last 8760 hours. HbA1C: No results for input(s): "HGBA1C" in the last 72 hours. CBG: No results for input(s): "GLUCAP" in the last 168 hours. Lipid Profile: No results for input(s): "CHOL", "HDL", "LDLCALC", "TRIG", "CHOLHDL", "LDLDIRECT" in the last 72 hours. Thyroid Function Tests: No results for input(s): "TSH", "T4TOTAL", "FREET4", "T3FREE", "THYROIDAB" in the last 72 hours. Anemia Panel: No results  for input(s): "VITAMINB12", "FOLATE", "FERRITIN", "TIBC", "IRON", "RETICCTPCT" in the last 72 hours. Sepsis Labs: Recent Labs  Lab 11/27/21 2242 11/28/21 0035 11/28/21 0405 11/29/21 0604  PROCALCITON 74.11  --  48.89 18.18  LATICACIDVEN 1.3 1.2  --   --     Recent Results (from the past 240 hour(s))  Culture, blood (Routine x 2)     Status: Abnormal (Preliminary result)   Collection Time: 11/27/21 10:43 PM   Specimen: BLOOD  Result Value Ref Range Status   Specimen Description   Final    BLOOD RIGHT ASSIST CONTROL Performed at [facility], [address] [place], [place] [address]    Special Requests   Final    BOTTLES DRAWN AEROBIC AND ANAEROBIC Blood Culture adequate volume Performed at [facility], [address] [place], [place] [address]    Culture  Setup Time   Final    Organism ID to follow [place] CRITICAL RESULT CALLED TO, READ BACK BY AND VERIFIED WITH: JASON ROBBINS @ [id number] ON 11/28/2021.Marland KitchenMarland KitchenTKR    Culture (A)  Final    ESCHERICHIA COLI SUSCEPTIBILITIES TO FOLLOW Performed at [facility], [place] [address] [place], [place] [address]    Report Status PENDING  Incomplete  Urine Culture     Status: Abnormal   Collection Time: 11/27/21 10:43 PM   Specimen: Urine, Random  Result Value Ref Range Status   Specimen Description   Final    URINE, RANDOM Performed at [facility], [address] [place], [place] [address]    Special Requests   Final    NONE Performed at [facility], [place], [place] [address]    Culture >=100,000 COLONIES/mL ESCHERICHIA COLI (A)  Final   Report Status 11/30/2021 FINAL  Final   Organism ID, Bacteria ESCHERICHIA COLI (A)  Final      Susceptibility   Escherichia coli - MIC*    AMPICILLIN >=32 RESISTANT Resistant     CEFAZOLIN <=4 SENSITIVE Sensitive     CEFEPIME <=0.12 SENSITIVE Sensitive     CEFTRIAXONE <=0.25 SENSITIVE Sensitive      CIPROFLOXACIN <=0.25 SENSITIVE Sensitive     GENTAMICIN <=1 SENSITIVE Sensitive     IMIPENEM <=0.25 SENSITIVE Sensitive     NITROFURANTOIN <=16 SENSITIVE Sensitive     TRIMETH/SULFA <=20 SENSITIVE Sensitive     AMPICILLIN/SULBACTAM 16 INTERMEDIATE Intermediate     PIP/TAZO <=4 SENSITIVE Sensitive     * >=100,000 COLONIES/mL ESCHERICHIA COLI  Blood Culture ID Panel (Reflexed)     Status: Abnormal   Collection Time: 11/27/21 10:43 PM  Result Value Ref Range Status   Enterococcus faecalis NOT DETECTED NOT DETECTED Final   Enterococcus Faecium NOT DETECTED NOT DETECTED Final   Listeria monocytogenes NOT DETECTED NOT DETECTED Final  Staphylococcus species NOT DETECTED NOT DETECTED Final   Staphylococcus aureus (BCID) NOT DETECTED NOT DETECTED Final   Staphylococcus epidermidis NOT DETECTED NOT DETECTED Final   Staphylococcus lugdunensis NOT DETECTED NOT DETECTED Final   Streptococcus species NOT DETECTED NOT DETECTED Final   Streptococcus agalactiae NOT DETECTED NOT DETECTED Final   Streptococcus pneumoniae NOT DETECTED NOT DETECTED Final   Streptococcus pyogenes NOT DETECTED NOT DETECTED Final   A.calcoaceticus-baumannii NOT DETECTED NOT DETECTED Final   Bacteroides fragilis NOT DETECTED NOT DETECTED Final   Enterobacterales DETECTED (A) NOT DETECTED Final    Comment: Enterobacterales represent a large order of gram negative bacteria, not a single organism. CRITICAL RESULT CALLED TO, READ BACK BY AND VERIFIED WITH: JASON ROBBINS @ 2319 ON 11/28/2021.Marland KitchenMarland KitchenTKR    Enterobacter cloacae complex NOT DETECTED NOT DETECTED Final   Escherichia coli DETECTED (A) NOT DETECTED Final    Comment: CRITICAL RESULT CALLED TO, READ BACK BY AND VERIFIED WITH: JASON ROBBINS @ 2319 ON 11/28/2021.Marland KitchenMarland KitchenTKR    Klebsiella aerogenes NOT DETECTED NOT DETECTED Final   Klebsiella oxytoca NOT DETECTED NOT DETECTED Final   Klebsiella pneumoniae NOT DETECTED NOT DETECTED Final   Proteus species NOT DETECTED NOT DETECTED  Final   Salmonella species NOT DETECTED NOT DETECTED Final   Serratia marcescens NOT DETECTED NOT DETECTED Final   Haemophilus influenzae NOT DETECTED NOT DETECTED Final   Neisseria meningitidis NOT DETECTED NOT DETECTED Final   Pseudomonas aeruginosa NOT DETECTED NOT DETECTED Final   Stenotrophomonas maltophilia NOT DETECTED NOT DETECTED Final   Candida albicans NOT DETECTED NOT DETECTED Final   Candida auris NOT DETECTED NOT DETECTED Final   Candida glabrata NOT DETECTED NOT DETECTED Final   Candida krusei NOT DETECTED NOT DETECTED Final   Candida parapsilosis NOT DETECTED NOT DETECTED Final   Candida tropicalis NOT DETECTED NOT DETECTED Final   Cryptococcus neoformans/gattii NOT DETECTED NOT DETECTED Final   CTX-M ESBL NOT DETECTED NOT DETECTED Final   Carbapenem resistance IMP NOT DETECTED NOT DETECTED Final   Carbapenem resistance KPC NOT DETECTED NOT DETECTED Final   Carbapenem resistance NDM NOT DETECTED NOT DETECTED Final   Carbapenem resist OXA 48 LIKE NOT DETECTED NOT DETECTED Final   Carbapenem resistance VIM NOT DETECTED NOT DETECTED Final    Comment: Performed at [facility], [address] [place], [place] [address]         Radiology Studies: No results found.      Scheduled Meds:  acidophilus  1 capsule Oral Daily   calcium carbonate  1 tablet Oral BID   estradiol  0.5 mg Oral Daily   famotidine  20 mg Oral Daily   gabapentin  100 mg Oral TID   heparin injection (subcutaneous)  5,000 Units Subcutaneous Q8H   rosuvastatin  10 mg Oral Once per day on Mon Wed Fri   valACYclovir  500 mg Oral Daily   Continuous Infusions:  ertapenem 1,000 mg (11/29/21 2202)    Assessment & Plan:   Principal Problem:   Sepsis due to gram-negative UTI (HCC) Active Problems:   Severe sepsis (HCC)   AKI (acute kidney injury) (HCC)   Psoriatic arthritis (HCC)   Essential hypertension   Asthma, chronic   Dyslipidemia   Pyelonephritis   Bacteremia    Hyponatremia   Hypokalemia   Severe Sepsis  due to gram-negative UTI (HCC)  and bacteremia Acute pyelonephritis Still spiking fevers 8/4 ID input was appreciated Bcx/ucx both e.coli Rocephin switched to ertapenem due to avoiding class effect with  cephalosporin for declining platelet. I think more sepsis causing the decline ID rec. 10 days abx treatement, can do cipro end date 8/8  E.Coli Bacteremia Likely from urine source Treatment as above   AKI (acute kidney injury) (HCC) Improved with IV fluids   Thrombocytopenia Likely due to sepsis Now slowly recovering No reports of bleed Continue to monitor  Hyponatremia Due to hypovolemia. But also component of chronicity based on previous labs Monitor Ivf now d/c'd    Hypokalemia Replaced and stable       Dyslipidemia Continue statins   Asthma, chronic No acute exacerbation Continue inhalers   Essential hypertension Bp improving . Will still continue to hold bp meds due to sepsis   Psoriatic arthritis (HCC) - The patient is on apremilast and Remicade. Hold for now         DVT prophylaxis: heparin Code Status:full Family Communication: son at bedside Disposition Plan: back home Status is: Inpatient Remains inpatient appropriate because: IV treatment , still with fevers, bactermia      LOS: 2 days   Time spent:    Lynn Ito, MD [facility] Pager [phone]  If 7PM-7AM, please contact night-coverage 11/30/2021, 8:25 AM

## 2021-11-30 NOTE — Progress Notes (Signed)
   11/30/21 1350  Mobility  Activity Ambulated independently in room  Level of Assistance Independent  Assistive Device None  Distance Ambulated (ft) 40 ft  Activity Response Tolerated well  $Mobility charge 1 Mobility   Pt supine in bed on RA upon arrival. Pt sts and ambulates within room. Pt returns to recliner with needs in reach.   Terrilyn Saver  Mobility Specialist  11/30/21 1:52 PM

## 2021-11-30 NOTE — Progress Notes (Signed)
Mobility Specialist - Progress Note    11/30/21 1457  Mobility  Activity Ambulated independently in room;Transferred from chair to bed  Level of Assistance Independent  Distance Ambulated (ft) 20 ft  Activity Response Tolerated well  $Mobility charge 1 Mobility    Pt sitting in chair on RA upon arrival. Pt STS and ambulates to bed indep. Pt left in bed with needs in reach.   Terrilyn Saver  Mobility Specialist  11/30/21 2:58 PM

## 2021-12-01 DIAGNOSIS — A4150 Gram-negative sepsis, unspecified: Secondary | ICD-10-CM | POA: Diagnosis not present

## 2021-12-01 DIAGNOSIS — N390 Urinary tract infection, site not specified: Secondary | ICD-10-CM | POA: Diagnosis not present

## 2021-12-01 LAB — SODIUM: Sodium: 128 mmol/L — ABNORMAL LOW (ref 135–145)

## 2021-12-01 LAB — PLATELET COUNT: Platelets: 160 10*3/uL (ref 150–400)

## 2021-12-01 LAB — CULTURE, BLOOD (ROUTINE X 2): Special Requests: ADEQUATE

## 2021-12-01 NOTE — Progress Notes (Signed)
PROGRESS NOTE    Jane Sanchez  [id number] DOB: Jun 13, 1960 DOA: 11/28/2021 PCP: Olin Hauser, DO    Brief Narrative:  Jane Sanchez is a 61 y.o. female with medical history significant for asthma, hypertension, dyslipidemia, osteoporosis and osteoarthritis, presented to the emergency room with acute onset of altered mental status with confusion.  The patient was fairly repetitive per her son and admits to having recent dysuria with associated urinary frequency, urgency and left flank pain.  She has been having fever and chills.  Her Tmax was 10 2-1 03 per her report.  No chest pain or dyspnea or cough.  No headache or dizziness or blurred vision.  She felt overall as if she was having the flu per her report.   ED Course: When she came to the ER temperature was 99.3 and blood pressure was 103/91 with a heart rate of 135.  Labs revealed hyponatremia 122 and hypochloremia of 89 with a CO2 of 19 blood glucose 106 and BUN 53 with a creatinine of 4.06 previously normal in January of this year, calcium 7.8 and albumin 2.9 with otherwise unremarkable LFTs.  Lactic acid was 1.3 and later 1.2 procalcitonin level was 74 point CBC showed leukocytosis 15.5 with neutrophilia and bandemia INR was 1.2 and PT 14.8   CT renal stone study revealed the following: 1. Stranding around the left kidney and ureter. No stone or hydronephrosis. Changes likely to represent pyelonephritis   8/3 blood culture positive for E. coli/Enterobacter. Febrile last night and today.  8/5 still febrile.   Consultants:  ID  Procedures:   Antimicrobials:  rocephin    Subjective: Feels better. No abd pain, n/v  Objective: Vitals:   11/30/21 1559 11/30/21 2015 12/01/21 0433 12/01/21 0808  BP: (!) 153/79 138/79 121/63 134/74  Pulse: 84 100 81 98  Resp: 18 16 16 18   Temp: 99.7 F (37.6 C) 100.1 F (37.8 C) 98 F (36.7 C) 99.7 F (37.6 C)  TempSrc:  Oral    SpO2: 100% 100% 99% 96%  Weight:       Height:       No intake or output data in the 24 hours ending 12/01/21 0842  Filed Weights   11/27/21 2236  Weight: 51.7 kg    Examination: Calm, NAD Cta no w/r Reg s1/s2 no gallop Soft benign +bs No edema Aaoxox3  Mood and affect appropriate in current setting     Data Reviewed: I have personally reviewed following labs and imaging studies  CBC: Recent Labs  Lab 11/27/21 2242 11/28/21 0405 11/29/21 1019 11/30/21 0605 12/01/21 0618  WBC 15.5* 14.6* 9.2 8.0  --   NEUTROABS 13.6*  --   --   --   --   HGB 12.4 10.9* 10.0* 9.1*  --   HCT 34.6* 31.6* 28.4* 25.4*  --   MCV 98.9 100.3* 97.9 95.8  --   PLT 173 142* 122* 124* 0000000   Basic Metabolic Panel: Recent Labs  Lab 11/27/21 2242 11/28/21 0405 11/29/21 1019 11/30/21 0605  NA 122* 127* 129* 127*  K 4.8 4.3 3.1* 3.6  CL 89* 100 101 103  CO2 19* 16* 18* 17*  GLUCOSE 105* 83 112* 88  BUN 53* 49* 28* 18  CREATININE 4.06* 3.40* 1.61* 1.03*  CALCIUM 7.8* 6.8* 7.2* 7.4*   GFR: Estimated Creatinine Clearance: 45.4 mL/min (A) (by C-G formula based on SCr of 1.03 mg/dL (H)). Liver Function Tests: Recent Labs  Lab 11/27/21 2242  AST 28  ALT  19  ALKPHOS 125  BILITOT 0.9  PROT 7.1  ALBUMIN 2.9*   No results for input(s): "LIPASE", "AMYLASE" in the last 168 hours. No results for input(s): "AMMONIA" in the last 168 hours. Coagulation Profile: Recent Labs  Lab 11/27/21 2242 11/28/21 0405  INR 1.2 1.3*   Cardiac Enzymes: No results for input(s): "CKTOTAL", "CKMB", "CKMBINDEX", "TROPONINI" in the last 168 hours. BNP (last 3 results) No results for input(s): "PROBNP" in the last 8760 hours. HbA1C: No results for input(s): "HGBA1C" in the last 72 hours. CBG: No results for input(s): "GLUCAP" in the last 168 hours. Lipid Profile: No results for input(s): "CHOL", "HDL", "LDLCALC", "TRIG", "CHOLHDL", "LDLDIRECT" in the last 72 hours. Thyroid Function Tests: No results for input(s): "TSH", "T4TOTAL",  "FREET4", "T3FREE", "THYROIDAB" in the last 72 hours. Anemia Panel: No results for input(s): "VITAMINB12", "FOLATE", "FERRITIN", "TIBC", "IRON", "RETICCTPCT" in the last 72 hours. Sepsis Labs: Recent Labs  Lab 11/27/21 2242 11/28/21 0035 11/28/21 0405 11/29/21 0604 11/30/21 0605  PROCALCITON 74.11  --  48.89 18.18 8.07  LATICACIDVEN 1.3 1.2  --   --   --     Recent Results (from the past 240 hour(s))  Culture, blood (Routine x 2)     Status: Abnormal (Preliminary result)   Collection Time: 11/27/21 10:43 PM   Specimen: BLOOD  Result Value Ref Range Status   Specimen Description   Final    BLOOD RIGHT ASSIST CONTROL Performed at [facility], [address] [place], [place] [address]    Special Requests   Final    BOTTLES DRAWN AEROBIC AND ANAEROBIC Blood Culture adequate volume Performed at [facility], [address] [place], [place] [address]    Culture  Setup Time   Final    Organism ID to follow GRAM NEGATIVE RODS ANAEROBIC BOTTLE ONLY CRITICAL RESULT CALLED TO, READ BACK BY AND VERIFIED WITH: JASON ROBBINS @ 2319 ON 11/28/2021.Marland KitchenMarland KitchenTKR    Culture (A)  Final    ESCHERICHIA COLI SUSCEPTIBILITIES TO FOLLOW Performed at [facility], [address], [place], [place] [address]    Report Status PENDING  Incomplete  Urine Culture     Status: Abnormal   Collection Time: 11/27/21 10:43 PM   Specimen: Urine, Random  Result Value Ref Range Status   Specimen Description   Final    URINE, RANDOM Performed at [facility], [address] [place], [place] [address]    Special Requests   Final    NONE Performed at [facility], [address] [place], [place] [address]    Culture >=100,000 COLONIES/mL ESCHERICHIA COLI (A)  Final   Report Status 11/30/2021 FINAL  Final   Organism ID, Bacteria ESCHERICHIA COLI (A)  Final      Susceptibility   Escherichia coli - MIC*    AMPICILLIN >=32 RESISTANT Resistant     CEFAZOLIN <=4  SENSITIVE Sensitive     CEFEPIME <=0.12 SENSITIVE Sensitive     CEFTRIAXONE <=0.25 SENSITIVE Sensitive     CIPROFLOXACIN <=0.25 SENSITIVE Sensitive     GENTAMICIN <=1 SENSITIVE Sensitive     IMIPENEM <=0.25 SENSITIVE Sensitive     NITROFURANTOIN <=16 SENSITIVE Sensitive     TRIMETH/SULFA <=20 SENSITIVE Sensitive     AMPICILLIN/SULBACTAM 16 INTERMEDIATE Intermediate     PIP/TAZO <=4 SENSITIVE Sensitive     * >=100,000 COLONIES/mL ESCHERICHIA COLI  Blood Culture ID Panel (Reflexed)     Status: Abnormal   Collection Time: 11/27/21 10:43 PM  Result Value Ref Range Status   Enterococcus faecalis NOT DETECTED NOT DETECTED Final   Enterococcus Faecium NOT DETECTED NOT DETECTED Final   Listeria monocytogenes NOT DETECTED NOT DETECTED Final   Staphylococcus species NOT DETECTED NOT DETECTED Final   Staphylococcus aureus (BCID) NOT DETECTED NOT DETECTED Final   Staphylococcus epidermidis NOT DETECTED NOT DETECTED Final   Staphylococcus lugdunensis NOT DETECTED NOT DETECTED Final   Streptococcus species NOT DETECTED NOT DETECTED Final   Streptococcus agalactiae NOT DETECTED NOT DETECTED Final   Streptococcus pneumoniae NOT DETECTED NOT DETECTED Final   Streptococcus pyogenes NOT DETECTED NOT DETECTED Final   A.calcoaceticus-baumannii NOT DETECTED NOT DETECTED Final   Bacteroides fragilis NOT DETECTED NOT DETECTED Final   Enterobacterales DETECTED (A) NOT DETECTED Final    Comment: Enterobacterales represent a large order of gram negative bacteria, not a single organism. CRITICAL RESULT CALLED TO, READ BACK BY AND VERIFIED WITH: JASON ROBBINS @ 2319 ON 11/28/2021.Marland KitchenMarland KitchenTKR    Enterobacter cloacae complex NOT DETECTED NOT DETECTED Final   Escherichia coli DETECTED (A) NOT DETECTED Final    Comment: CRITICAL RESULT CALLED TO, READ BACK BY AND VERIFIED WITH: JASON ROBBINS @ 2319 ON 11/28/2021.Marland KitchenMarland KitchenTKR    Klebsiella aerogenes NOT DETECTED NOT DETECTED Final   Klebsiella oxytoca NOT DETECTED NOT DETECTED  Final   Klebsiella pneumoniae NOT DETECTED NOT DETECTED Final   Proteus species NOT DETECTED NOT DETECTED Final   Salmonella species NOT DETECTED NOT DETECTED Final   Serratia marcescens NOT DETECTED NOT DETECTED Final   Haemophilus influenzae NOT DETECTED NOT DETECTED Final   Neisseria meningitidis NOT DETECTED NOT DETECTED Final   Pseudomonas aeruginosa NOT DETECTED NOT DETECTED Final   Stenotrophomonas maltophilia NOT DETECTED NOT DETECTED Final   Candida albicans NOT DETECTED NOT DETECTED Final   Candida auris NOT DETECTED NOT DETECTED Final   Candida glabrata NOT DETECTED NOT DETECTED Final   Candida krusei NOT DETECTED NOT DETECTED Final   Candida parapsilosis NOT DETECTED NOT DETECTED Final   Candida tropicalis NOT DETECTED NOT DETECTED Final   Cryptococcus neoformans/gattii NOT DETECTED NOT DETECTED Final   CTX-M ESBL NOT DETECTED NOT DETECTED Final   Carbapenem resistance IMP NOT DETECTED NOT DETECTED Final   Carbapenem resistance KPC NOT DETECTED NOT DETECTED Final   Carbapenem resistance NDM NOT DETECTED NOT DETECTED Final   Carbapenem resist OXA 48 LIKE NOT DETECTED NOT DETECTED Final   Carbapenem resistance VIM NOT DETECTED NOT DETECTED Final    Comment: Performed at [facility], [address] [place], [place] [address]         Radiology Studies: No results found.      Scheduled Meds:  acidophilus  1 capsule Oral Daily   calcium carbonate  1 tablet Oral BID   estradiol  0.5 mg Oral Daily   famotidine  20 mg Oral Daily   gabapentin  100 mg Oral TID   heparin injection (subcutaneous)  5,000 Units Subcutaneous Q8H   rosuvastatin  10 mg Oral Once per day on Mon Wed Fri   valACYclovir  500 mg Oral Daily   Continuous Infusions:  ciprofloxacin 400 mg (12/01/21 0545)    Assessment & Plan:   Principal Problem:   Sepsis due to gram-negative UTI (HCC) Active Problems:   Severe sepsis (HCC)   AKI (acute kidney injury) (HCC)   Psoriatic arthritis  (HCC)   Essential hypertension   Asthma, chronic   Dyslipidemia   Pyelonephritis   Bacteremia   Hyponatremia   Hypokalemia  Severe Sepsis  due to gram-negative UTI (HCC)  and bacteremia Acute pyelonephritis Still spiking fevers 8/4 ID input was appreciated Bcx/ucx both e.coli Rocephin switched to ertapenem due to avoiding class effect with cephalosporin for declining platelet. I think more sepsis causing the decline 8/5 ID rec 10 days abx treatment , can do cipro end date 8/8   E.Coli Bacteremia Likely from urine source Procalcitonin decreasing Tx as above   AKI (acute kidney injury) (HCC) Prerenal  Improved with ivf   Thrombocytopenia Likely due to sepsis Now slowly recovering No reports of bleed Continue to monitor  Hyponatremia Due to hypovolemia. But also component of chronicity based on previous labs 8/5 off ivf Na 128.    Hypokalemia Replaced and stable       Dyslipidemia Continue statins   Asthma, chronic No acute exacerbation Continue inhalers   Essential hypertension Bp improving . Will still continue to hold bp meds due to sepsis   Psoriatic arthritis (HCC) - The patient is on apremilast and Remicade. Hold for now         DVT prophylaxis: heparin Code Status:full Family Communication: son at bedside Disposition Plan: back home Status is: Inpatient Remains inpatient appropriate because: IV treatment , still with fevers, bactermia.       LOS: 3 days   Time spent:    Lynn Ito, MD [facility] Pager [phone]  If 7PM-7AM, please contact night-coverage 12/01/2021, 8:42 AM

## 2021-12-01 NOTE — Plan of Care (Signed)
  Problem: Health Behavior/Discharge Planning: Goal: Ability to manage health-related needs will improve Outcome: Progressing   Problem: Clinical Measurements: Goal: Ability to maintain clinical measurements within normal limits will improve Outcome: Progressing Goal: Will remain free from infection Outcome: Progressing Goal: Diagnostic test results will improve Outcome: Progressing Goal: Respiratory complications will improve Outcome: Progressing Goal: Cardiovascular complication will be avoided Outcome: Progressing   Problem: Activity: Goal: Risk for activity intolerance will decrease Outcome: Progressing   Problem: Nutrition: Goal: Adequate nutrition will be maintained Outcome: Progressing   Problem: Coping: Goal: Level of anxiety will decrease Outcome: Progressing   Problem: Elimination: Goal: Will not experience complications related to bowel motility Outcome: Progressing Goal: Will not experience complications related to urinary retention Outcome: Progressing   Problem: Pain Managment: Goal: General experience of comfort will improve Outcome: Progressing   Problem: Safety: Goal: Ability to remain free from injury will improve Outcome: Progressing   Problem: Skin Integrity: Goal: Risk for impaired skin integrity will decrease Outcome: Progressing   Problem: Fluid Volume: Goal: Hemodynamic stability will improve Outcome: Progressing   Problem: Clinical Measurements: Goal: Diagnostic test results will improve Outcome: Progressing Goal: Signs and symptoms of infection will decrease Outcome: Progressing   Problem: Respiratory: Goal: Ability to maintain adequate ventilation will improve Outcome: Progressing   Problem: Urinary Elimination: Goal: Signs and symptoms of infection will decrease Outcome: Progressing

## 2021-12-02 DIAGNOSIS — A4150 Gram-negative sepsis, unspecified: Secondary | ICD-10-CM | POA: Diagnosis not present

## 2021-12-02 DIAGNOSIS — N390 Urinary tract infection, site not specified: Secondary | ICD-10-CM | POA: Diagnosis not present

## 2021-12-02 MED ORDER — CIPROFLOXACIN HCL 500 MG PO TABS: 500.0000 mg | ORAL_TABLET | Freq: Two times a day (BID) | ORAL | 0 refills | Status: AC

## 2021-12-02 MED ORDER — CALCIUM CARBONATE ANTACID 500 MG PO CHEW: 1.0000 | CHEWABLE_TABLET | Freq: Two times a day (BID) | ORAL | Status: DC

## 2021-12-02 NOTE — Discharge Summary (Signed)
Jane Sanchez [id number] DOB: 12-05-1960 DOA: 11/28/2021  PCP: Olin Hauser, DO  Admit date: 11/28/2021 Discharge date: 12/02/2021  Admitted From: Home Disposition: Home  Recommendations for Outpatient Follow-up:  Follow up with PCP in 1 week Please obtain BMP/CBC in one week       Discharge Condition:Stable CODE STATUS: Full Diet recommendation: Heart Healthy  History: Per HPI: Jane Sanchez is a 61 y.o. female with medical history significant for asthma, hypertension, dyslipidemia, osteoporosis and osteoarthritis, presented to the emergency room with acute onset of altered mental status with confusion.  The patient was fairly repetitive per her son and admits to having recent dysuria with associated urinary frequency, urgency and left flank pain.  She has been having fever and chills. CT renal stone study revealed the following:1. Stranding around the left kidney and ureter. No stone or hydronephrosis. Changes likely to represent pyelonephritis.  She was found with sepsis and treated for bacteremia and UTI. ID was consulted.  Received IV antibiotics.  She is stable to be discharged home today.   Severe Sepsis  due to E.coli bacteremia and UTI Acute pyelonephritis ID consulted Treated with IV antibiotics and will transition per IDs recommendation to p.o. to complete antibiotics on 12/04/2021 Blood culture and urine culture both E. Coli    E.Coli Bacteremia Likely from urine source Procalcitonin decreasing Tx as above     AKI (acute kidney injury) ([place]) Prerenal  Improved with ivf     Thrombocytopenia Likely due to sepsis Improved and normal now    Hyponatremia Due to hypovolemia. But also component of chronicity based on previous labs Na 128. Follow-up PCP for further management       Hypokalemia Replaced and stable          Dyslipidemia Continue statins   Asthma, chronic No acute exacerbation Continue inhalers   Essential  hypertension Holding bp meds, once bp improves as outpatient can resume Follow-up with PCP   Psoriatic arthritis ([place]) Follow-up with PCP continue meds           Discharge Diagnoses:  Principal Problem:   Sepsis due to gram-negative UTI ([place]) Active Problems:   Severe sepsis ([place])   AKI (acute kidney injury) ([place])   Psoriatic arthritis ([place])   Essential hypertension   Asthma, chronic   Dyslipidemia   Pyelonephritis   Bacteremia   Hyponatremia   Hypokalemia    Discharge Instructions  Discharge Instructions     Call MD for:  temperature >100.4   Complete by: As directed    Diet - low sodium heart healthy   Complete by: As directed    Diet - low sodium heart healthy   Complete by: As directed    Discharge instructions   Complete by: As directed    F/u with pcp   Discharge instructions   Complete by: As directed    F/u with pcp Hydrate Take your first dose of antibiotic at 18:00 tonight Monitor blood pressure , once it starts rising can take your toprol xl. Discuss with pcp   Increase activity slowly   Complete by: As directed    Increase activity slowly   Complete by: As directed       Allergies as of 12/02/2021       Reactions   Calcitonin Hives   Cephalosporins Other (See Comments)   Bee Venom Swelling   Severe swelling and hyperventilate   Cephalexin Other (See Comments)   bruising Pt states that this caused a blood disorder causing bruising  Bleeding disorder with bruising   Adalimumab Rash        Medication List     STOP taking these medications    losartan 50 MG tablet Commonly known as: COZAAR   metoprolol succinate 25 MG 24 hr tablet Commonly known as: TOPROL-XL       TAKE these medications    albuterol 108 (90 Base) MCG/ACT inhaler Commonly known as: VENTOLIN HFA Inhale 1-2 puffs into the lungs every 4 (four) hours as needed for wheezing or shortness of breath.   albuterol (2.5 MG/3ML) 0.083% nebulizer solution Commonly  known as: PROVENTIL Take 3 mLs (2.5 mg total) by nebulization every 6 (six) hours as needed for wheezing or shortness of breath.   calcium carbonate 500 MG chewable tablet Commonly known as: TUMS - dosed in mg elemental calcium Chew 1 tablet (200 mg of elemental calcium total) by mouth 2 (two) times daily.   Cimzia 2 X 200 MG/ML Pskt Generic drug: certolizumab pegol Inject into the skin.   ciprofloxacin 500 MG tablet Commonly known as: Cipro Take 1 tablet (500 mg total) by mouth 2 (two) times daily for 5 doses. First dose tonight at 18:00   cyclobenzaprine 10 MG tablet Commonly known as: FLEXERIL Take 0.5-1 tablets (5-10 mg total) by mouth 2 (two) times daily as needed for muscle spasms.   estradiol 0.5 MG tablet Commonly known as: ESTRACE TAKE 1 TABLET BY MOUTH ONCE DAILY   gabapentin 300 MG capsule Commonly known as: NEURONTIN Take 1 capsule (300 mg total) by mouth 3 (three) times daily.   Prolia 60 MG/ML Sosy injection Generic drug: denosumab Inject 60 mg into the skin every 6 (six) months.   rosuvastatin 10 MG tablet Commonly known as: CRESTOR Take 1 tablet (10 mg total) by mouth 3 (three) times a week.   valACYclovir 500 MG tablet Commonly known as: Valtrex Take 1 tablet (500 mg total) by mouth daily. For suppression        Allergies  Allergen Reactions   Calcitonin Hives   Cephalosporins Other (See Comments)   Bee Venom Swelling    Severe swelling and hyperventilate    Cephalexin Other (See Comments)    bruising Pt states that this caused a blood disorder causing bruising Bleeding disorder with bruising    Adalimumab Rash    Consultations: ID   Procedures/Studies: CT HEAD WO CONTRAST ( )  Result Date: 11/28/2021 CLINICAL DATA:  Altered mental status. EXAM: CT HEAD WITHOUT CONTRAST TECHNIQUE: Contiguous axial images were obtained from the base of the skull through the vertex without intravenous contrast. RADIATION DOSE REDUCTION: This exam was  performed according to the departmental dose-optimization program which includes automated exposure control, adjustment of the mA and/or kV according to patient size and/or use of iterative reconstruction technique. COMPARISON:  None Available. FINDINGS: Brain: The ventricles and sulci are appropriate size for the patient's age. The gray-white matter discrimination is preserved. There is no acute intracranial hemorrhage. No mass effect or midline shift. No extra-axial fluid collection. Vascular: No hyperdense vessel or unexpected calcification. Skull: Normal. Negative for fracture or focal lesion. Sinuses/Orbits: Mild mucoperiosteal thickening of paranasal sinuses with opacification of several ethmoid air cells. No air-fluid. The mid of the visualized paranasal sinuses and mastoid air cells are clear. Other: None IMPRESSION: No acute intracranial pathology. Electronically Signed   By: Elgie Collard M.D.   On: 11/28/2021 01:57   CT Renal Stone Study  Result Date: 11/28/2021 CLINICAL DATA:  Sepsis, urinary tract infection, right CVA tenderness.  EXAM: CT ABDOMEN AND PELVIS WITHOUT CONTRAST TECHNIQUE: Multidetector CT imaging of the abdomen and pelvis was performed following the standard protocol without IV contrast. RADIATION DOSE REDUCTION: This exam was performed according to the departmental dose-optimization program which includes automated exposure control, adjustment of the mA and/or kV according to patient size and/or use of iterative reconstruction technique. COMPARISON:  None Available. FINDINGS: Lower chest: Lung bases are clear. Large esophageal hiatal hernia behind the heart. Hepatobiliary: No focal liver abnormality is seen. No gallstones, gallbladder wall thickening, or biliary dilatation. Pancreas: Unremarkable. No pancreatic ductal dilatation or surrounding inflammatory changes. Spleen: Normal in size without focal abnormality. Adrenals/Urinary Tract: No adrenal gland nodules. Stranding around the  left kidney and ureter. No hydronephrosis or hydroureter. No stones are identified. Changes likely to represent pyelonephritis although this could also indicate sequela from a recently passed stone in the appropriate clinical setting. Bladder wall is thickened, suggesting cystitis. Right kidney is normal. Stomach/Bowel: Stomach is within normal limits. Appendix is not identified. No evidence of bowel wall thickening, distention, or inflammatory changes. Vascular/Lymphatic: Aortic atherosclerosis. No enlarged abdominal or pelvic lymph nodes. Reproductive: Status post hysterectomy. No adnexal masses. Other: No abdominal wall hernia or abnormality. No abdominopelvic ascites. Musculoskeletal: Degenerative changes. Mild lumbar scoliosis convex towards the left. Multiple endplate compression deformities in the lumbar spine, likely indicating osteoporosis. IMPRESSION: 1. Stranding around the left kidney and ureter. No stone or hydronephrosis. Changes likely to represent pyelonephritis. 2. Bladder wall thickening suggesting cystitis. 3. Large esophageal hiatal hernia. 4. Aortic atherosclerosis. 5. Multiple endplate compression deformities in the lumbar spine suggesting osteoporosis. Electronically Signed   By: Lucienne Capers M.D.   On: 11/28/2021 01:56   DG Chest Port 1 View  Result Date: 11/27/2021 CLINICAL DATA:  Fever and fatigue. EXAM: PORTABLE CHEST 1 VIEW COMPARISON:  None Available. FINDINGS: Mild diffuse interstitial prominence may represent mild edema. No focal consolidation, pleural effusion or pneumothorax. The cardiac silhouette is within limits. Small hiatal hernia. No acute osseous pathology. IMPRESSION: Mild diffuse interstitial prominence may represent mild edema. No focal consolidation. Electronically Signed   By: Anner Crete M.D.   On: 11/27/2021 22:50      Subjective:   Discharge Exam: Vitals:   12/02/21 0825 12/02/21 0832  BP: 116/77   Pulse: (!) 121 92  Resp: 18   Temp: 98.2 F  (36.8 C)   SpO2: 98%    Vitals:   12/01/21 2127 12/02/21 0522 12/02/21 0825 12/02/21 0832  BP: 138/86 128/74 116/77   Pulse: 85 99 (!) 121 92  Resp: 16 18 18    Temp: 98.9 F (37.2 C) 98.6 F (37 C) 98.2 F (36.8 C)   TempSrc:   Oral   SpO2: 100% 95% 98%   Weight:      Height:        General: Pt is alert, awake, not in acute distress Cardiovascular: RRR, S1/S2 +, no rubs, no gallops Respiratory: CTA bilaterally, no wheezing, no rhonchi Abdominal: Soft, NT, ND, bowel sounds + Extremities: no edema, no cyanosis    The results of significant diagnostics from this hospitalization (including imaging, microbiology, ancillary and laboratory) are listed below for reference.     Microbiology: Recent Results (from the past 240 hour(s))  Culture, blood (Routine x 2)     Status: Abnormal   Collection Time: 11/27/21 10:43 PM   Specimen: BLOOD  Result Value Ref Range Status   Specimen Description   Final    BLOOD RIGHT ASSIST CONTROL Performed at [facility], [address], [place], [place] [address]    Special Requests   Final    BOTTLES DRAWN AEROBIC AND ANAEROBIC Blood Culture adequate volume Performed at [facility], [address] [place], [place] [address]    Culture  Setup Time   Final    Organism ID to follow GRAM NEGATIVE RODS ANAEROBIC BOTTLE ONLY CRITICAL RESULT CALLED TO, READ BACK BY AND VERIFIED WITH: JASON ROBBINS @ 2319 ON 11/28/2021.Marland KitchenMarland KitchenTKR    Culture ESCHERICHIA COLI (A)  Final   Report Status 12/01/2021 FINAL  Final   Organism ID, Bacteria ESCHERICHIA COLI  Final      Susceptibility   Escherichia coli - MIC*    AMPICILLIN >=32 RESISTANT Resistant     CEFAZOLIN <=4 SENSITIVE Sensitive     CEFEPIME <=0.12 SENSITIVE Sensitive     CEFTAZIDIME <=1 SENSITIVE Sensitive     CEFTRIAXONE <=0.25 SENSITIVE Sensitive     CIPROFLOXACIN <=0.25 SENSITIVE Sensitive     GENTAMICIN <=1 SENSITIVE Sensitive     IMIPENEM <=0.25 SENSITIVE Sensitive      TRIMETH/SULFA <=20 SENSITIVE Sensitive     AMPICILLIN/SULBACTAM >=32 RESISTANT Resistant     PIP/TAZO <=4 SENSITIVE Sensitive     * ESCHERICHIA COLI  Urine Culture     Status: Abnormal   Collection Time: 11/27/21 10:43 PM   Specimen: Urine, Random  Result Value Ref Range Status   Specimen Description   Final    URINE, RANDOM Performed at [facility], [address] [place], [place] [address]    Special Requests   Final    NONE Performed at [facility], [address] [place], [place] [address]    Culture >=100,000 COLONIES/mL ESCHERICHIA COLI (A)  Final   Report Status 11/30/2021 FINAL  Final   Organism ID, Bacteria ESCHERICHIA COLI (A)  Final      Susceptibility   Escherichia coli - MIC*    AMPICILLIN >=32 RESISTANT Resistant     CEFAZOLIN <=4 SENSITIVE Sensitive     CEFEPIME <=0.12 SENSITIVE Sensitive     CEFTRIAXONE <=0.25 SENSITIVE Sensitive     CIPROFLOXACIN <=0.25 SENSITIVE Sensitive     GENTAMICIN <=1 SENSITIVE Sensitive     IMIPENEM <=0.25 SENSITIVE Sensitive     NITROFURANTOIN <=16 SENSITIVE Sensitive     TRIMETH/SULFA <=20 SENSITIVE Sensitive     AMPICILLIN/SULBACTAM 16 INTERMEDIATE Intermediate     PIP/TAZO <=4 SENSITIVE Sensitive     * >=100,000 COLONIES/mL ESCHERICHIA COLI  Blood Culture ID Panel (Reflexed)     Status: Abnormal   Collection Time: 11/27/21 10:43 PM  Result Value Ref Range Status   Enterococcus faecalis NOT DETECTED NOT DETECTED Final   Enterococcus Faecium NOT DETECTED NOT DETECTED Final   Listeria monocytogenes NOT DETECTED NOT DETECTED Final   Staphylococcus species NOT DETECTED NOT DETECTED Final   Staphylococcus aureus (BCID) NOT DETECTED NOT DETECTED Final   Staphylococcus epidermidis NOT DETECTED NOT DETECTED Final   Staphylococcus lugdunensis NOT DETECTED NOT DETECTED Final   Streptococcus species NOT DETECTED NOT DETECTED Final   Streptococcus agalactiae NOT DETECTED NOT DETECTED Final   Streptococcus pneumoniae  NOT DETECTED NOT DETECTED Final   Streptococcus pyogenes NOT DETECTED NOT DETECTED Final   A.calcoaceticus-baumannii NOT DETECTED NOT DETECTED Final   Bacteroides fragilis NOT DETECTED NOT DETECTED Final   Enterobacterales DETECTED (A) NOT DETECTED Final    Comment: Enterobacterales represent a large order of gram negative bacteria, not a single organism. CRITICAL RESULT CALLED TO, READ BACK BY  AND VERIFIED WITH: JASON ROBBINS @ 2319 ON 11/28/2021.Marland KitchenMarland KitchenTKR    Enterobacter cloacae complex NOT DETECTED NOT DETECTED Final   Escherichia coli DETECTED (A) NOT DETECTED Final    Comment: CRITICAL RESULT CALLED TO, READ BACK BY AND VERIFIED WITH: JASON ROBBINS @ 2319 ON 11/28/2021.Marland KitchenMarland KitchenTKR    Klebsiella aerogenes NOT DETECTED NOT DETECTED Final   Klebsiella oxytoca NOT DETECTED NOT DETECTED Final   Klebsiella pneumoniae NOT DETECTED NOT DETECTED Final   Proteus species NOT DETECTED NOT DETECTED Final   Salmonella species NOT DETECTED NOT DETECTED Final   Serratia marcescens NOT DETECTED NOT DETECTED Final   Haemophilus influenzae NOT DETECTED NOT DETECTED Final   Neisseria meningitidis NOT DETECTED NOT DETECTED Final   Pseudomonas aeruginosa NOT DETECTED NOT DETECTED Final   Stenotrophomonas maltophilia NOT DETECTED NOT DETECTED Final   Candida albicans NOT DETECTED NOT DETECTED Final   Candida auris NOT DETECTED NOT DETECTED Final   Candida glabrata NOT DETECTED NOT DETECTED Final   Candida krusei NOT DETECTED NOT DETECTED Final   Candida parapsilosis NOT DETECTED NOT DETECTED Final   Candida tropicalis NOT DETECTED NOT DETECTED Final   Cryptococcus neoformans/gattii NOT DETECTED NOT DETECTED Final   CTX-M ESBL NOT DETECTED NOT DETECTED Final   Carbapenem resistance IMP NOT DETECTED NOT DETECTED Final   Carbapenem resistance KPC NOT DETECTED NOT DETECTED Final   Carbapenem resistance NDM NOT DETECTED NOT DETECTED Final   Carbapenem resist OXA 48 LIKE NOT DETECTED NOT DETECTED Final   Carbapenem  resistance VIM NOT DETECTED NOT DETECTED Final    Comment: Performed at [facility], [address] [place], [place] [address]     Labs: BNP (last 3 results) No results for input(s): "BNP" in the last 8760 hours. Basic Metabolic Panel: Recent Labs  Lab 11/27/21 2242 11/28/21 0405 11/29/21 1019 11/30/21 0605 12/01/21 0621  NA 122* 127* 129* 127* 128*  K 4.8 4.3 3.1* 3.6  --   CL 89* 100 101 103  --   CO2 19* 16* 18* 17*  --   GLUCOSE 105* 83 112* 88  --   BUN 53* 49* 28* 18  --   CREATININE 4.06* 3.40* 1.61* 1.03*  --   CALCIUM 7.8* 6.8* 7.2* 7.4*  --    Liver Function Tests: Recent Labs  Lab 11/27/21 2242  AST 28  ALT 19  ALKPHOS 125  BILITOT 0.9  PROT 7.1  ALBUMIN 2.9*   No results for input(s): "LIPASE", "AMYLASE" in the last 168 hours. No results for input(s): "AMMONIA" in the last 168 hours. CBC: Recent Labs  Lab 11/27/21 2242 11/28/21 0405 11/29/21 1019 11/30/21 0605 12/01/21 0618  WBC 15.5* 14.6* 9.2 8.0  --   NEUTROABS 13.6*  --   --   --   --   HGB 12.4 10.9* 10.0* 9.1*  --   HCT 34.6* 31.6* 28.4* 25.4*  --   MCV 98.9 100.3* 97.9 95.8  --   PLT 173 142* 122* 124* 160   Cardiac Enzymes: No results for input(s): "CKTOTAL", "CKMB", "CKMBINDEX", "TROPONINI" in the last 168 hours. BNP: Invalid input(s): "POCBNP" CBG: No results for input(s): "GLUCAP" in the last 168 hours. D-Dimer No results for input(s): "DDIMER" in the last 72 hours. Hgb A1c No results for input(s): "HGBA1C" in the last 72 hours. Lipid Profile No results for input(s): "CHOL", "HDL", "LDLCALC", "TRIG", "CHOLHDL", "LDLDIRECT" in the last 72 hours. Thyroid function studies No results for input(s): "TSH", "T4TOTAL", "T3FREE", "THYROIDAB" in the last 72 hours.  Invalid input(s): "FREET3" Anemia work up No results for input(s): "VITAMINB12", "FOLATE", "FERRITIN", "TIBC", "IRON", "RETICCTPCT" in the last 72 hours. Urinalysis    Component Value Date/Time   COLORURINE  AMBER (A) 11/27/2021 2243   APPEARANCEUR TURBID (A) 11/27/2021 2243   LABSPEC 1.015 11/27/2021 2243   PHURINE 5.0 11/27/2021 2243   GLUCOSEU NEGATIVE 11/27/2021 2243   HGBUR MODERATE (A) 11/27/2021 2243   BILIRUBINUR NEGATIVE 11/27/2021 2243   KETONESUR 5 (A) 11/27/2021 2243   PROTEINUR 100 (A) 11/27/2021 2243   NITRITE NEGATIVE 11/27/2021 2243   LEUKOCYTESUR LARGE (A) 11/27/2021 2243   Sepsis Labs Recent Labs  Lab 11/27/21 2242 11/28/21 0405 11/29/21 1019 11/30/21 0605  WBC 15.5* 14.6* 9.2 8.0   Microbiology Recent Results (from the past 240 hour(s))  Culture, blood (Routine x 2)     Status: Abnormal   Collection Time: 11/27/21 10:43 PM   Specimen: BLOOD  Result Value Ref Range Status   Specimen Description   Final    BLOOD RIGHT ASSIST CONTROL Performed at [facility], [address] [place], [place] [address]    Special Requests   Final    BOTTLES DRAWN AEROBIC AND ANAEROBIC Blood Culture adequate volume Performed at [facility], [address] [place], [place] [address]    Culture  Setup Time   Final    Organism ID to follow [place] CRITICAL RESULT CALLED TO, READ BACK BY AND VERIFIED WITH: JASON ROBBINS @ [id number] ON 11/28/2021.Marland KitchenMarland KitchenTKR    Culture ESCHERICHIA COLI (A)  Final   Report Status 12/01/2021 FINAL  Final   Organism ID, Bacteria ESCHERICHIA COLI  Final      Susceptibility   Escherichia coli - MIC*    AMPICILLIN >=32 RESISTANT Resistant     CEFAZOLIN <=4 SENSITIVE Sensitive     CEFEPIME <=0.12 SENSITIVE Sensitive     CEFTAZIDIME <=1 SENSITIVE Sensitive     CEFTRIAXONE <=0.25 SENSITIVE Sensitive     CIPROFLOXACIN <=0.25 SENSITIVE Sensitive     GENTAMICIN <=1 SENSITIVE Sensitive     IMIPENEM <=0.25 SENSITIVE Sensitive     TRIMETH/SULFA <=20 SENSITIVE Sensitive     AMPICILLIN/SULBACTAM >=32 RESISTANT Resistant     PIP/TAZO <=4 SENSITIVE Sensitive     * ESCHERICHIA COLI  Urine Culture     Status:  Abnormal   Collection Time: 11/27/21 10:43 PM   Specimen: Urine, Random  Result Value Ref Range Status   Specimen Description   Final    URINE, RANDOM Performed at [facility], [address] [place], [place] [address]    Special Requests   Final    NONE Performed at [facility], [address] [place], [place] [address]    Culture >=100,000 COLONIES/mL ESCHERICHIA COLI (A)  Final   Report Status 11/30/2021 FINAL  Final   Organism ID, Bacteria ESCHERICHIA COLI (A)  Final      Susceptibility   Escherichia coli - MIC*    AMPICILLIN >=32 RESISTANT Resistant     CEFAZOLIN <=4 SENSITIVE Sensitive     CEFEPIME <=0.12 SENSITIVE Sensitive     CEFTRIAXONE <=0.25 SENSITIVE Sensitive     CIPROFLOXACIN <=0.25 SENSITIVE Sensitive     GENTAMICIN <=1 SENSITIVE Sensitive     IMIPENEM <=0.25 SENSITIVE Sensitive     NITROFURANTOIN <=16 SENSITIVE Sensitive     TRIMETH/SULFA <=20 SENSITIVE Sensitive     AMPICILLIN/SULBACTAM 16 INTERMEDIATE Intermediate     PIP/TAZO <=4 SENSITIVE Sensitive     * >=  100,000 COLONIES/mL ESCHERICHIA COLI  Blood Culture ID Panel (Reflexed)     Status: Abnormal   Collection Time: 11/27/21 10:43 PM  Result Value Ref Range Status   Enterococcus faecalis NOT DETECTED NOT DETECTED Final   Enterococcus Faecium NOT DETECTED NOT DETECTED Final   Listeria monocytogenes NOT DETECTED NOT DETECTED Final   Staphylococcus species NOT DETECTED NOT DETECTED Final   Staphylococcus aureus (BCID) NOT DETECTED NOT DETECTED Final   Staphylococcus epidermidis NOT DETECTED NOT DETECTED Final   Staphylococcus lugdunensis NOT DETECTED NOT DETECTED Final   Streptococcus species NOT DETECTED NOT DETECTED Final   Streptococcus agalactiae NOT DETECTED NOT DETECTED Final   Streptococcus pneumoniae NOT DETECTED NOT DETECTED Final   Streptococcus pyogenes NOT DETECTED NOT DETECTED Final   A.calcoaceticus-baumannii NOT DETECTED NOT DETECTED Final   Bacteroides fragilis NOT  DETECTED NOT DETECTED Final   Enterobacterales DETECTED (A) NOT DETECTED Final    Comment: Enterobacterales represent a large order of gram negative bacteria, not a single organism. CRITICAL RESULT CALLED TO, READ BACK BY AND VERIFIED WITH: JASON ROBBINS @ [id number] ON 11/28/2021.Marland KitchenMarland KitchenTKR    Enterobacter cloacae complex NOT DETECTED NOT DETECTED Final   Escherichia coli DETECTED (A) NOT DETECTED Final    Comment: CRITICAL RESULT CALLED TO, READ BACK BY AND VERIFIED WITH: JASON ROBBINS @ [id number] ON 11/28/2021.Marland KitchenMarland KitchenTKR    Klebsiella aerogenes NOT DETECTED NOT DETECTED Final   Klebsiella oxytoca NOT DETECTED NOT DETECTED Final   Klebsiella pneumoniae NOT DETECTED NOT DETECTED Final   Proteus species NOT DETECTED NOT DETECTED Final   Salmonella species NOT DETECTED NOT DETECTED Final   Serratia marcescens NOT DETECTED NOT DETECTED Final   Haemophilus influenzae NOT DETECTED NOT DETECTED Final   Neisseria meningitidis NOT DETECTED NOT DETECTED Final   Pseudomonas aeruginosa NOT DETECTED NOT DETECTED Final   Stenotrophomonas maltophilia NOT DETECTED NOT DETECTED Final   Candida albicans NOT DETECTED NOT DETECTED Final   Candida auris NOT DETECTED NOT DETECTED Final   Candida glabrata NOT DETECTED NOT DETECTED Final   Candida krusei NOT DETECTED NOT DETECTED Final   Candida parapsilosis NOT DETECTED NOT DETECTED Final   Candida tropicalis NOT DETECTED NOT DETECTED Final   Cryptococcus neoformans/gattii NOT DETECTED NOT DETECTED Final   CTX-M ESBL NOT DETECTED NOT DETECTED Final   Carbapenem resistance IMP NOT DETECTED NOT DETECTED Final   Carbapenem resistance KPC NOT DETECTED NOT DETECTED Final   Carbapenem resistance NDM NOT DETECTED NOT DETECTED Final   Carbapenem resist OXA 48 LIKE NOT DETECTED NOT DETECTED Final   Carbapenem resistance VIM NOT DETECTED NOT DETECTED Final    Comment: Performed at [facility], [address], [place], [place] [address]     Time coordinating discharge: Over  30 minutes  SIGNED:   Nolberto Hanlon, MD  [facility] 12/02/2021, 12:19 PM Pager   If 7PM-7AM, please contact night-coverage [email or web address] Password [email or web address]

## 2021-12-10 ENCOUNTER — Ambulatory Visit (INDEPENDENT_AMBULATORY_CARE_PROVIDER_SITE_OTHER): Payer: BC Managed Care – PPO | Admitting: Family Medicine

## 2021-12-10 ENCOUNTER — Encounter: Payer: Self-pay | Admitting: Family Medicine

## 2021-12-10 VITALS — BP 142/84 | HR 90 | Ht 62.0 in | Wt 115.0 lb

## 2021-12-10 DIAGNOSIS — N179 Acute kidney failure, unspecified: Secondary | ICD-10-CM | POA: Diagnosis not present

## 2021-12-10 DIAGNOSIS — I10 Essential (primary) hypertension: Secondary | ICD-10-CM

## 2021-12-10 DIAGNOSIS — Z8619 Personal history of other infectious and parasitic diseases: Secondary | ICD-10-CM

## 2021-12-10 DIAGNOSIS — G4700 Insomnia, unspecified: Secondary | ICD-10-CM

## 2021-12-10 DIAGNOSIS — D649 Anemia, unspecified: Secondary | ICD-10-CM

## 2021-12-10 DIAGNOSIS — E871 Hypo-osmolality and hyponatremia: Secondary | ICD-10-CM | POA: Diagnosis not present

## 2021-12-10 MED ORDER — TRAZODONE HCL 50 MG PO TABS: 25.0000 mg | ORAL_TABLET | Freq: Every evening | ORAL | 2 refills | Status: DC | PRN

## 2021-12-10 NOTE — Patient Instructions (Addendum)
Thank you for coming to the office today.  Keep an eye on your BP  Tomorrow may restart Metoprolol XL 25mg  daily  If BP is still >140/90, can restart Losartan 50mg  daily. Otherwise okay to hold on this one.  Labs today to check kidney, sodium, and blood count.  For insomnia, sleep - go ahead and try the Trazodone 50mg  nightly as needed. You can start with HALF tab = 25mg  for sleep. May take every night or can skip some nights.   Please schedule a Follow-up Appointment to: Return if symptoms worsen or fail to improve.  If you have any other questions or concerns, please feel free to call the office or send a message through MyChart. You may also schedule an earlier appointment if necessary.  Additionally, you may be receiving a survey about your experience at our office within a few days to 1 week by e-mail or mail. We value your feedback.  , DO [facility], 

## 2021-12-10 NOTE — Progress Notes (Signed)
Subjective:    Patient ID: Jane Sanchez, female    DOB: 06/13/1960, 61 y.o.   MRN: [id number]  Jane Sanchez is a 61 y.o. female presenting on 12/10/2021 for Hospitalization Follow-up   HPI  HOSPITAL FOLLOW-UP VISIT  Hospital/Location: [facility] Date of Admission: 11/28/21 Date of Discharge: 12/02/21 Transitions of care telephone call: Not completed yet  Reason for Admission: Urosepsis  [facility] H&P and Discharge Summary have been reviewed - Patient presents today 8 days after recent hospitalization. Brief summary of recent course, patient had symptoms of confusion and fever altered mental status, hospitalized, found to have UTI and metabolic abnormality treated with IV antibiotics, had cultures urine and blood confirmed E Coli infection. Transitioned to oral antibiotics, with Cipro finished last dose last week.  - Today reports overall has done well after discharge. Symptoms are much improved. She is improving hydration. No fever, feels much better. She feels a little weak and improving now. She just restarted physical therapy.  She has held her BP medications Losartan 50mg  and Metoprolol XL 25mg , her BP has raised some now BP > 140  She admits waking up over night occasionally every few hours, unsure why. She took Trazodone PRN in hospital  - New medications on discharge: Cipro, now finished - Changes to current meds on discharge: HOLD Losartan Metoprolol  I have reviewed the discharge medication list, and have reconciled the current and discharge medications today.   Current Outpatient Medications:  .  albuterol (PROVENTIL) (2.5 MG/3ML) 0.083% nebulizer solution, Take 3 mLs (2.5 mg total) by nebulization every 6 (six) hours as needed for wheezing or shortness of breath., Disp: 150 mL, Rfl: 1 .  albuterol (VENTOLIN HFA) 108 (90 Base) MCG/ACT inhaler, Inhale 1-2 puffs into the lungs every 4 (four) hours as needed for wheezing or shortness of breath., Disp: 8 g, Rfl: 3 .  calcium  carbonate (TUMS - DOSED IN MG ELEMENTAL CALCIUM) 500 MG chewable tablet, Chew 1 tablet (200 mg of elemental calcium total) by mouth 2 (two) times daily., Disp: , Rfl:  .  CIMZIA 2 X 200 MG/ML PSKT, Inject into the skin., Disp: , Rfl:  .  cyclobenzaprine (FLEXERIL) 10 MG tablet, Take 0.5-1 tablets (5-10 mg total) by mouth 2 (two) times daily as needed for muscle spasms., Disp: 90 tablet, Rfl: 1 .  denosumab (PROLIA) 60 MG/ML SOSY injection, Inject 60 mg into the skin every 6 (six) months., Disp: , Rfl:  .  estradiol (ESTRACE) 0.5 MG tablet, TAKE 1 TABLET BY MOUTH ONCE DAILY, Disp: 90 tablet, Rfl: 0 .  gabapentin (NEURONTIN) 300 MG capsule, Take 1 capsule (300 mg total) by mouth 3 (three) times daily., Disp: 270 capsule, Rfl: 3 .  metoprolol succinate (TOPROL-XL) 25 MG 24 hr tablet, Take 1 tablet (25 mg total) by mouth daily., Disp: 90 tablet, Rfl: 3 .  rosuvastatin (CRESTOR) 10 MG tablet, Take 1 tablet (10 mg total) by mouth 3 (three) times a week., Disp: 36 tablet, Rfl: 3 .  traZODone (DESYREL) 50 MG tablet, Take 0.5-1 tablets (25-50 mg total) by mouth at bedtime as needed for sleep., Disp: 30 tablet, Rfl: 2 .  valACYclovir (VALTREX) 500 MG tablet, Take 1 tablet (500 mg total) by mouth daily. For suppression, Disp: 90 tablet, Rfl: 3  ------------------------------------------------------------------------- Social History   Tobacco Use  . Smoking status: Never  . Smokeless tobacco: Never  Substance Use Topics  . Alcohol use: Yes    Alcohol/week: 2.0 standard drinks of alcohol  Types: 2 Glasses of wine per week    Review of Systems Per HPI unless specifically indicated above     Objective:    BP (!) 142/84 (BP Location: Left Arm, Cuff Size: Normal)   Pulse 90   Ht 5\' 2"  (1.575 m)   Wt 115 lb (52.2 kg)   SpO2 100%   BMI 21.03 kg/m   Wt Readings from Last 3 Encounters:  12/10/21 115 lb (52.2 kg)  11/27/21 114 lb (51.7 kg)  06/19/21 115 lb 3.2 oz (52.3 kg)    Physical  Exam Vitals and nursing note reviewed.  Constitutional:      General: She is not in acute distress.    Appearance: Normal appearance. She is well-developed. She is not diaphoretic.     Comments: Well-appearing, comfortable, cooperative  HENT:     Head: Normocephalic and atraumatic.  Eyes:     General:        Right eye: No discharge.        Left eye: No discharge.     Conjunctiva/sclera: Conjunctivae normal.  Cardiovascular:     Rate and Rhythm: Normal rate.  Pulmonary:     Effort: Pulmonary effort is normal.  Skin:    General: Skin is warm and dry.     Findings: No erythema or rash.  Neurological:     Mental Status: She is alert and oriented to person, place, and time.  Psychiatric:        Mood and Affect: Mood normal.        Behavior: Behavior normal.        Thought Content: Thought content normal.     Comments: Well groomed, good eye contact, normal speech and thoughts    I have personally reviewed the radiology report from 11/27/21 and 11/28/21 Imaging studies.  CLINICAL DATA:  Sepsis, urinary tract infection, right CVA tenderness.   EXAM: CT ABDOMEN AND PELVIS WITHOUT CONTRAST   TECHNIQUE: Multidetector CT imaging of the abdomen and pelvis was performed following the standard protocol without IV contrast.   RADIATION DOSE REDUCTION: This exam was performed according to the departmental dose-optimization program which includes automated exposure control, adjustment of the mA and/or kV according to patient size and/or use of iterative reconstruction technique.   COMPARISON:  None Available.   FINDINGS: Lower chest: Lung bases are clear. Large esophageal hiatal hernia behind the heart.   Hepatobiliary: No focal liver abnormality is seen. No gallstones, gallbladder wall thickening, or biliary dilatation.   Pancreas: Unremarkable. No pancreatic ductal dilatation or surrounding inflammatory changes.   Spleen: Normal in size without focal abnormality.    Adrenals/Urinary Tract: No adrenal gland nodules. Stranding around the left kidney and ureter. No hydronephrosis or hydroureter. No stones are identified. Changes likely to represent pyelonephritis although this could also indicate sequela from a recently passed stone in the appropriate clinical setting. Bladder wall is thickened, suggesting cystitis. Right kidney is normal.   Stomach/Bowel: Stomach is within normal limits. Appendix is not identified. No evidence of bowel wall thickening, distention, or inflammatory changes.   Vascular/Lymphatic: Aortic atherosclerosis. No enlarged abdominal or pelvic lymph nodes.   Reproductive: Status post hysterectomy. No adnexal masses.   Other: No abdominal wall hernia or abnormality. No abdominopelvic ascites.   Musculoskeletal: Degenerative changes. Mild lumbar scoliosis convex towards the left. Multiple endplate compression deformities in the lumbar spine, likely indicating osteoporosis.   IMPRESSION: 1. Stranding around the left kidney and ureter. No stone or hydronephrosis. Changes likely to represent pyelonephritis. 2.  Bladder wall thickening suggesting cystitis. 3. Large esophageal hiatal hernia. 4. Aortic atherosclerosis. 5. Multiple endplate compression deformities in the lumbar spine suggesting osteoporosis.     Electronically Signed   By: Burman Nieves M.D.   On: 11/28/2021 01:56   CLINICAL DATA:  Altered mental status.   EXAM: CT HEAD WITHOUT CONTRAST   TECHNIQUE: Contiguous axial images were obtained from the base of the skull through the vertex without intravenous contrast.   RADIATION DOSE REDUCTION: This exam was performed according to the departmental dose-optimization program which includes automated exposure control, adjustment of the mA and/or kV according to patient size and/or use of iterative reconstruction technique.   COMPARISON:  None Available.   FINDINGS: Brain: The ventricles and sulci are  appropriate size for the patient's age. The gray-white matter discrimination is preserved. There is no acute intracranial hemorrhage. No mass effect or midline shift. No extra-axial fluid collection.   Vascular: No hyperdense vessel or unexpected calcification.   Skull: Normal. Negative for fracture or focal lesion.   Sinuses/Orbits: Mild mucoperiosteal thickening of paranasal sinuses with opacification of several ethmoid air cells. No air-fluid. The mid of the visualized paranasal sinuses and mastoid air cells are clear.   Other: None   IMPRESSION: No acute intracranial pathology.     Electronically Signed   By: Elgie Collard M.D.   On: 11/28/2021 01:57   CLINICAL DATA:  Fever and fatigue.   EXAM: PORTABLE CHEST 1 VIEW   COMPARISON:  None Available.   FINDINGS: Mild diffuse interstitial prominence may represent mild edema. No focal consolidation, pleural effusion or pneumothorax. The cardiac silhouette is within limits. Small hiatal hernia. No acute osseous pathology.   IMPRESSION: Mild diffuse interstitial prominence may represent mild edema. No focal consolidation.     Electronically Signed   By: Elgie Collard M.D.   On: 11/27/2021 22:50  Results for orders placed or performed during the hospital encounter of 11/28/21  Culture, blood (Routine x 2)   Specimen: BLOOD  Result Value Ref Range   Specimen Description      BLOOD RIGHT ASSIST CONTROL Performed at [facility], [address], [place], [place] [address]    Special Requests      BOTTLES DRAWN AEROBIC AND ANAEROBIC Blood Culture adequate volume Performed at [facility], [address] [place], [place] [address]    Culture  Setup Time      Organism ID to follow GRAM NEGATIVE RODS ANAEROBIC BOTTLE ONLY CRITICAL RESULT CALLED TO, READ BACK BY AND VERIFIED WITH: JASON ROBBINS @ 2319 ON 11/28/2021.Marland KitchenMarland KitchenTKR    Culture ESCHERICHIA COLI (A)    Report Status 12/01/2021 FINAL     Organism ID, Bacteria ESCHERICHIA COLI       Susceptibility   Escherichia coli - MIC*    AMPICILLIN >=32 RESISTANT Resistant     CEFAZOLIN <=4 SENSITIVE Sensitive     CEFEPIME <=0.12 SENSITIVE Sensitive     CEFTAZIDIME <=1 SENSITIVE Sensitive     CEFTRIAXONE <=0.25 SENSITIVE Sensitive     CIPROFLOXACIN <=0.25 SENSITIVE Sensitive     GENTAMICIN <=1 SENSITIVE Sensitive     IMIPENEM <=0.25 SENSITIVE Sensitive     TRIMETH/SULFA <=20 SENSITIVE Sensitive     AMPICILLIN/SULBACTAM >=32 RESISTANT Resistant     PIP/TAZO <=4 SENSITIVE Sensitive     * ESCHERICHIA COLI  Urine Culture   Specimen: Urine, Random  Result Value Ref Range   Specimen Description      URINE, RANDOM Performed at [facility], [address] [place], [place] [address]    Special Requests      NONE Performed at [facility], [address] [place], [place] [address]    Culture >=100,000 COLONIES/mL ESCHERICHIA COLI (A)    Report Status 11/30/2021 FINAL    Organism ID, Bacteria ESCHERICHIA COLI (A)       Susceptibility   Escherichia coli - MIC*    AMPICILLIN >=32 RESISTANT Resistant     CEFAZOLIN <=4 SENSITIVE Sensitive     CEFEPIME <=0.12 SENSITIVE Sensitive     CEFTRIAXONE <=0.25 SENSITIVE Sensitive     CIPROFLOXACIN <=0.25 SENSITIVE Sensitive     GENTAMICIN <=1 SENSITIVE Sensitive     IMIPENEM <=0.25 SENSITIVE Sensitive     NITROFURANTOIN <=16 SENSITIVE Sensitive     TRIMETH/SULFA <=20 SENSITIVE Sensitive     AMPICILLIN/SULBACTAM 16 INTERMEDIATE Intermediate     PIP/TAZO <=4 SENSITIVE Sensitive     * >=100,000 COLONIES/mL ESCHERICHIA COLI  Blood Culture ID Panel (Reflexed)  Result Value Ref Range   Enterococcus faecalis NOT DETECTED NOT DETECTED   Enterococcus Faecium NOT DETECTED NOT DETECTED   Listeria monocytogenes NOT DETECTED NOT DETECTED   Staphylococcus species NOT DETECTED NOT DETECTED   Staphylococcus aureus (BCID) NOT DETECTED NOT DETECTED   Staphylococcus epidermidis NOT  DETECTED NOT DETECTED   Staphylococcus lugdunensis NOT DETECTED NOT DETECTED   Streptococcus species NOT DETECTED NOT DETECTED   Streptococcus agalactiae NOT DETECTED NOT DETECTED   Streptococcus pneumoniae NOT DETECTED NOT DETECTED   Streptococcus pyogenes NOT DETECTED NOT DETECTED   A.calcoaceticus-baumannii NOT DETECTED NOT DETECTED   Bacteroides fragilis NOT DETECTED NOT DETECTED   Enterobacterales DETECTED (A) NOT DETECTED   Enterobacter cloacae complex NOT DETECTED NOT DETECTED   Escherichia coli DETECTED (A) NOT DETECTED   Klebsiella aerogenes NOT DETECTED NOT DETECTED   Klebsiella oxytoca NOT DETECTED NOT DETECTED   Klebsiella pneumoniae NOT DETECTED NOT DETECTED   Proteus species NOT DETECTED NOT DETECTED   Salmonella species NOT DETECTED NOT DETECTED   Serratia marcescens NOT DETECTED NOT DETECTED   Haemophilus influenzae NOT DETECTED NOT DETECTED   Neisseria meningitidis NOT DETECTED NOT DETECTED   Pseudomonas aeruginosa NOT DETECTED NOT DETECTED   Stenotrophomonas maltophilia NOT DETECTED NOT DETECTED   Candida albicans NOT DETECTED NOT DETECTED   Candida auris NOT DETECTED NOT DETECTED   Candida glabrata NOT DETECTED NOT DETECTED   Candida krusei NOT DETECTED NOT DETECTED   Candida parapsilosis NOT DETECTED NOT DETECTED   Candida tropicalis NOT DETECTED NOT DETECTED   Cryptococcus neoformans/gattii NOT DETECTED NOT DETECTED   CTX-M ESBL NOT DETECTED NOT DETECTED   Carbapenem resistance IMP NOT DETECTED NOT DETECTED   Carbapenem resistance KPC NOT DETECTED NOT DETECTED   Carbapenem resistance NDM NOT DETECTED NOT DETECTED   Carbapenem resist OXA 48 LIKE NOT DETECTED NOT DETECTED   Carbapenem resistance VIM NOT DETECTED NOT DETECTED  Comprehensive metabolic panel  Result Value Ref Range   Sodium 122 (L) 135 - 145 mmol/L   Potassium 4.8 3.5 - 5.1 mmol/L   Chloride 89 (L) 98 - 111 mmol/L   CO2 19 (L) 22 - 32 mmol/L   Glucose, Bld 105 (H) 70 - 99 mg/dL   BUN 53 (H) 8  - 23 mg/dL   Creatinine, Ser 6.30 (H) 0.44 - 1.00 mg/dL   Calcium 7.8 (L) 8.9 - 10.3 mg/dL   Total Protein 7.1 6.5 - 8.1 g/dL   Albumin 2.9 (L) 3.5 - 5.0 g/dL   AST  28 15 - 41 U/L   ALT 19 0 - 44 U/L   Alkaline Phosphatase 125 38 - 126 U/L   Total Bilirubin 0.9 0.3 - 1.2 mg/dL   GFR, Estimated 12 (L) >60 mL/min   Anion gap 14 5 - 15  Lactic acid, plasma  Result Value Ref Range   Lactic Acid, Venous 1.3 0.5 - 1.9 mmol/L  Lactic acid, plasma  Result Value Ref Range   Lactic Acid, Venous 1.2 0.5 - 1.9 mmol/L  CBC with Differential  Result Value Ref Range   WBC 15.5 (H) 4.0 - 10.5 K/uL   RBC 3.50 (L) 3.87 - 5.11 MIL/uL   Hemoglobin 12.4 12.0 - 15.0 g/dL   HCT 16.1 (L) 09.6 - 04.5 %   MCV 98.9 80.0 - 100.0 fL   MCH 35.4 (H) 26.0 - 34.0 pg   MCHC 35.8 30.0 - 36.0 g/dL   RDW 40.9 81.1 - 91.4 %   Platelets 173 150 - 400 K/uL   nRBC 0.3 (H) 0.0 - 0.2 %   Neutrophils Relative % 88 %   Neutro Abs 13.6 (H) 1.7 - 7.7 K/uL   Lymphocytes Relative 6 %   Lymphs Abs 1.0 0.7 - 4.0 K/uL   Monocytes Relative 3 %   Monocytes Absolute 0.5 0.1 - 1.0 K/uL   Eosinophils Relative 0 %   Eosinophils Absolute 0.0 0.0 - 0.5 K/uL   Basophils Relative 1 %   Basophils Absolute 0.1 0.0 - 0.1 K/uL   WBC Morphology MILD LEFT SHIFT (1-5% METAS, OCC MYELO, OCC BANDS)    RBC Morphology MORPHOLOGY UNREMARKABLE    Smear Review Normal platelet morphology    Immature Granulocytes 2 %   Abs Immature Granulocytes 0.37 (H) 0.00 - 0.07 K/uL  Protime-INR  Result Value Ref Range   Prothrombin Time 14.8 11.4 - 15.2 seconds   INR 1.2 0.8 - 1.2  Urinalysis, Routine w reflex microscopic  Result Value Ref Range   Color, Urine AMBER (A) YELLOW   APPearance TURBID (A) CLEAR   Specific Gravity, Urine 1.015 1.005 - 1.030   pH 5.0 5.0 - 8.0   Glucose, UA NEGATIVE NEGATIVE mg/dL   Hgb urine dipstick MODERATE (A) NEGATIVE   Bilirubin Urine NEGATIVE NEGATIVE   Ketones, ur 5 (A) NEGATIVE mg/dL   Protein, ur 782 (A)  NEGATIVE mg/dL   Nitrite NEGATIVE NEGATIVE   Leukocytes,Ua LARGE (A) NEGATIVE   RBC / HPF 21-50 0 - 5 RBC/hpf   WBC, UA >50 (H) 0 - 5 WBC/hpf   Bacteria, UA MANY (A) NONE SEEN   Squamous Epithelial / LPF 0-5 0 - 5   WBC Clumps PRESENT    Mucus PRESENT   Procalcitonin - Baseline  Result Value Ref Range   Procalcitonin 74.11 ng/mL  HIV Antibody (routine testing w rflx)  Result Value Ref Range   HIV Screen 4th Generation wRfx Non Reactive Non Reactive  Protime-INR  Result Value Ref Range   Prothrombin Time 15.9 (H) 11.4 - 15.2 seconds   INR 1.3 (H) 0.8 - 1.2  Cortisol-am, blood  Result Value Ref Range   Cortisol - AM 22.8 (H) 6.7 - 22.6 ug/dL  Procalcitonin  Result Value Ref Range   Procalcitonin 48.89 ng/mL  Basic metabolic panel  Result Value Ref Range   Sodium 127 (L) 135 - 145 mmol/L   Potassium 4.3 3.5 - 5.1 mmol/L   Chloride 100 98 - 111 mmol/L   CO2 16 (L) 22 - 32 mmol/L  Glucose, Bld 83 70 - 99 mg/dL   BUN 49 (H) 8 - 23 mg/dL   Creatinine, Ser 9.563.40 (H) 0.44 - 1.00 mg/dL   Calcium 6.8 (L) 8.9 - 10.3 mg/dL   GFR, Estimated 15 (L) >60 mL/min   Anion gap 11 5 - 15  CBC  Result Value Ref Range   WBC 14.6 (H) 4.0 - 10.5 K/uL   RBC 3.15 (L) 3.87 - 5.11 MIL/uL   Hemoglobin 10.9 (L) 12.0 - 15.0 g/dL   HCT 21.331.6 (L) 08.636.0 - 57.846.0 %   MCV 100.3 (H) 80.0 - 100.0 fL   MCH 34.6 (H) 26.0 - 34.0 pg   MCHC 34.5 30.0 - 36.0 g/dL   RDW 46.911.6 62.911.5 - 52.815.5 %   Platelets 142 (L) 150 - 400 K/uL   nRBC 0.1 0.0 - 0.2 %  Procalcitonin  Result Value Ref Range   Procalcitonin 18.18 ng/mL  CBC  Result Value Ref Range   WBC 9.2 4.0 - 10.5 K/uL   RBC 2.90 (L) 3.87 - 5.11 MIL/uL   Hemoglobin 10.0 (L) 12.0 - 15.0 g/dL   HCT 41.328.4 (L) 24.436.0 - 01.046.0 %   MCV 97.9 80.0 - 100.0 fL   MCH 34.5 (H) 26.0 - 34.0 pg   MCHC 35.2 30.0 - 36.0 g/dL   RDW 27.211.7 53.611.5 - 64.415.5 %   Platelets 122 (L) 150 - 400 K/uL   nRBC 0.2 0.0 - 0.2 %  Basic metabolic panel  Result Value Ref Range   Sodium 129 (L) 135 -  145 mmol/L   Potassium 3.1 (L) 3.5 - 5.1 mmol/L   Chloride 101 98 - 111 mmol/L   CO2 18 (L) 22 - 32 mmol/L   Glucose, Bld 112 (H) 70 - 99 mg/dL   BUN 28 (H) 8 - 23 mg/dL   Creatinine, Ser 0.341.61 (H) 0.44 - 1.00 mg/dL   Calcium 7.2 (L) 8.9 - 10.3 mg/dL   GFR, Estimated 36 (L) >60 mL/min   Anion gap 10 5 - 15  Procalcitonin  Result Value Ref Range   Procalcitonin 8.07 ng/mL  Basic metabolic panel  Result Value Ref Range   Sodium 127 (L) 135 - 145 mmol/L   Potassium 3.6 3.5 - 5.1 mmol/L   Chloride 103 98 - 111 mmol/L   CO2 17 (L) 22 - 32 mmol/L   Glucose, Bld 88 70 - 99 mg/dL   BUN 18 8 - 23 mg/dL   Creatinine, Ser 7.421.03 (H) 0.44 - 1.00 mg/dL   Calcium 7.4 (L) 8.9 - 10.3 mg/dL   GFR, Estimated >59>60 >56>60 mL/min   Anion gap 7 5 - 15  CBC  Result Value Ref Range   WBC 8.0 4.0 - 10.5 K/uL   RBC 2.65 (L) 3.87 - 5.11 MIL/uL   Hemoglobin 9.1 (L) 12.0 - 15.0 g/dL   HCT 38.725.4 (L) 56.436.0 - 33.246.0 %   MCV 95.8 80.0 - 100.0 fL   MCH 34.3 (H) 26.0 - 34.0 pg   MCHC 35.8 30.0 - 36.0 g/dL   RDW 95.111.6 88.411.5 - 16.615.5 %   Platelets 124 (L) 150 - 400 K/uL   nRBC 0.0 0.0 - 0.2 %  Platelet count  Result Value Ref Range   Platelets 160 150 - 400 K/uL  Sodium  Result Value Ref Range   Sodium 128 (L) 135 - 145 mmol/L      Assessment & Plan:   Problem List Items Addressed This Visit     AKI (acute  kidney injury) (HCC) - Primary   Essential hypertension   Relevant Medications   metoprolol succinate (TOPROL-XL) 25 MG 24 hr tablet   Other Relevant Orders   BASIC METABOLIC PANEL WITH GFR   CBC with Differential/Platelet   Hyponatremia   Relevant Orders   BASIC METABOLIC PANEL WITH GFR   Other Visit Diagnoses     History of sepsis       Anemia, unspecified type       Relevant Orders   CBC with Differential/Platelet   Insomnia, unspecified type       Relevant Medications   traZODone (DESYREL) 50 MG tablet       Add back Metoprolol XL 25mg  daily Still HOLD Losartan 50mg  daily for now.  Keep  an eye on your BP Tomorrow may restart Metoprolol XL 25mg  daily If BP is still >140/90, can restart Losartan 50mg  daily. Otherwise okay to hold on this one.  AKI Resolved in hospital, will repeat labs Labs today to check kidney, sodium, and blood count.  For insomnia, sleep - go ahead and try the Trazodone 50mg  nightly as needed. You can start with HALF tab = 25mg  for sleep. May take every night or can skip some nights.   Meds ordered this encounter  Medications  . traZODone (DESYREL) 50 MG tablet    Sig: Take 0.5-1 tablets (25-50 mg total) by mouth at bedtime as needed for sleep.    Dispense:  30 tablet    Refill:  2    Follow up plan: Return if symptoms worsen or fail to improve.  , DO [facility] 12/10/2021, 3:41 PM

## 2021-12-11 LAB — BASIC METABOLIC PANEL WITH GFR
BUN: 7 mg/dL (ref 7–25)
CO2: 25 mmol/L (ref 20–32)
Calcium: 8.5 mg/dL — ABNORMAL LOW (ref 8.6–10.4)
Chloride: 103 mmol/L (ref 98–110)
Creat: 0.68 mg/dL (ref 0.50–1.05)
Glucose, Bld: 93 mg/dL (ref 65–139)
Potassium: 3.5 mmol/L (ref 3.5–5.3)
Sodium: 138 mmol/L (ref 135–146)
eGFR: 98 mL/min/{1.73_m2} (ref >=60)

## 2021-12-11 LAB — CBC WITH DIFFERENTIAL/PLATELET
Absolute Monocytes: 514 cells/uL (ref 200–950)
Basophils Absolute: 159 cells/uL (ref 0–200)
Basophils Relative: 3 %
Eosinophils Absolute: 80 cells/uL (ref 15–500)
Eosinophils Relative: 1.5 %
HCT: 28.6 % — ABNORMAL LOW (ref 35.0–45.0)
Hemoglobin: 9.9 g/dL — ABNORMAL LOW (ref 11.7–15.5)
Lymphs Abs: 2480 cells/uL (ref 850–3900)
MCH: 35.4 pg — ABNORMAL HIGH (ref 27.0–33.0)
MCHC: 34.6 g/dL (ref 32.0–36.0)
MCV: 102.1 fL — ABNORMAL HIGH (ref 80.0–100.0)
MPV: 9.7 fL (ref 7.5–12.5)
Monocytes Relative: 9.7 %
Neutro Abs: 2067 cells/uL (ref 1500–7800)
Neutrophils Relative %: 39 %
Platelets: 505 10*3/uL — ABNORMAL HIGH (ref 140–400)
RBC: 2.8 10*6/uL — ABNORMAL LOW (ref 3.80–5.10)
RDW: 11.2 % (ref 11.0–15.0)
Total Lymphocyte: 46.8 %
WBC: 5.3 10*3/uL (ref 3.8–10.8)

## 2021-12-20 ENCOUNTER — Other Ambulatory Visit: Payer: Self-pay | Admitting: Family Medicine

## 2021-12-20 DIAGNOSIS — G8929 Other chronic pain: Secondary | ICD-10-CM

## 2021-12-20 DIAGNOSIS — B001 Herpesviral vesicular dermatitis: Secondary | ICD-10-CM

## 2021-12-20 DIAGNOSIS — E782 Mixed hyperlipidemia: Secondary | ICD-10-CM

## 2021-12-20 NOTE — Telephone Encounter (Signed)
Requested medication (s) are due for refill today: expired medications  Requested medication (s) are on the active medication list: yes  Last refill:  valtrex- 12/15/20 #90 3 refills, crestor- 12/15/20 #36 3 refills, flexeril- 06/19/21 #90 1 refill  Future visit scheduled: yes in 3 weeks   Notes to clinic:  flexeril- not delegated per protocol. Valtrex, crestor expired medications      Requested Prescriptions  Pending Prescriptions Disp Refills   valACYclovir (VALTREX) 500 MG tablet [Pharmacy Med Name: VALACYCLOVIR HCL 500 MG TAB] 90 tablet 3    Sig: TAKE 1 TABLET BY MOUTH ONCE DAILY FOR SUPPRESSION     Antimicrobials:  Antiviral Agents - Anti-Herpetic Passed - 12/20/2021 10:06 AM      Passed - Valid encounter within last 12 months    Recent Outpatient Visits           1 week ago AKI (acute kidney injury) (HCC)   [facility] Smitty Cords, DO   6 months ago Essential hypertension   [facility] Smitty Cords, DO   9 months ago Mild intermittent asthma with exacerbation   [facility] [place], Netta Neat, DO   1 year ago Psoriatic arthritis [facility])   [facility] Althea Charon, Netta Neat, DO       Future Appointments             In 3 weeks Althea Charon, Netta Neat, DO [facility], [facility]             cyclobenzaprine (FLEXERIL) 10 MG tablet [Pharmacy Med Name: CYCLOBENZAPRINE HCL 10 MG TAB] 90 tablet 1    Sig: TAKE 1/2-1 TABLET BY MOUTH TWICE DAILY AS NEEDED FOR MUSCLE SPASMS     Not Delegated - Analgesics:  Muscle Relaxants Failed - 12/20/2021 10:06 AM      Failed - This refill cannot be delegated      Passed - Valid encounter within last 6 months    Recent Outpatient Visits           1 week ago AKI (acute kidney injury) (HCC)   [facility] [place], Netta Neat, DO   6 months ago Essential hypertension   [facility] Smitty Cords, DO   9 months ago Mild intermittent asthma with exacerbation   [facility] [place], Netta Neat, DO   1 year ago Psoriatic arthritis [facility])   [facility] Althea Charon, Netta Neat, DO       Future Appointments             In 3 weeks Althea Charon, Netta Neat, DO [facility], [facility]             rosuvastatin (CRESTOR) 10 MG tablet [Pharmacy Med Name: ROSUVASTATIN CALCIUM 10 MG TAB] 36 tablet 3    Sig: TAKE 1 TABLET BY MOUTH THREE TIMES A WEEK     Cardiovascular:  Antilipid - Statins 2 Failed - 12/20/2021 10:06 AM      Failed - Lipid Panel in normal range within the last 12 months    No results found for: "CHOL", "POCCHOL", "CHOLTOT" No results found for: "LDLCALC", "LDLC", "HIRISKLDL", "POCLDL", "LDLDIRECT", "REALLDLC", "TOTLDLC" No results found for: "HDL", "POCHDL" No results found for: "TRIG", "POCTRIG"       Passed - Cr in normal range and within 360 days    Creat  Date Value Ref Range Status  12/10/2021 0.68 0.50 - 1.05  mg/dL Final         Passed - Patient is not pregnant      Passed - Valid encounter within last 12 months    Recent Outpatient Visits           1 week ago AKI (acute kidney injury) [facility])   [facility] Smitty Cords, DO   6 months ago Essential hypertension   [facility] Smitty Cords, DO   9 months ago Mild intermittent asthma with exacerbation   [facility] Smitty Cords, DO   1 year ago Psoriatic arthritis [facility])   [facility] Althea Charon, Netta Neat, DO       Future Appointments             In 3 weeks Althea Charon, Netta Neat, DO [facility], [facility]

## 2021-12-24 ENCOUNTER — Other Ambulatory Visit: Payer: BC Managed Care – PPO

## 2021-12-26 ENCOUNTER — Ambulatory Visit: Payer: BC Managed Care – PPO | Admitting: Family Medicine

## 2022-01-07 ENCOUNTER — Other Ambulatory Visit: Payer: Self-pay

## 2022-01-07 DIAGNOSIS — I10 Essential (primary) hypertension: Secondary | ICD-10-CM

## 2022-01-07 DIAGNOSIS — E559 Vitamin D deficiency, unspecified: Secondary | ICD-10-CM

## 2022-01-07 DIAGNOSIS — M810 Age-related osteoporosis without current pathological fracture: Secondary | ICD-10-CM

## 2022-01-07 DIAGNOSIS — Z0000 Encounter for general adult medical examination without abnormal findings: Secondary | ICD-10-CM

## 2022-01-07 DIAGNOSIS — E782 Mixed hyperlipidemia: Secondary | ICD-10-CM

## 2022-01-07 DIAGNOSIS — L4050 Arthropathic psoriasis, unspecified: Secondary | ICD-10-CM

## 2022-01-08 ENCOUNTER — Other Ambulatory Visit: Payer: BC Managed Care – PPO

## 2022-01-09 LAB — COMPLETE METABOLIC PANEL WITH GFR
AG Ratio: 1.3 (calc) (ref 1.0–2.5)
ALT: 11 U/L (ref 6–29)
AST: 17 U/L (ref 10–35)
Albumin: 3.7 g/dL (ref 3.6–5.1)
Alkaline phosphatase (APISO): 72 U/L (ref 37–153)
BUN: 7 mg/dL (ref 7–25)
CO2: 26 mmol/L (ref 20–32)
Calcium: 9.2 mg/dL (ref 8.6–10.4)
Chloride: 98 mmol/L (ref 98–110)
Creat: 0.87 mg/dL (ref 0.50–1.05)
Globulin: 2.8 g/dL (calc) (ref 1.9–3.7)
Glucose, Bld: 84 mg/dL (ref 65–99)
Potassium: 4 mmol/L (ref 3.5–5.3)
Sodium: 132 mmol/L — ABNORMAL LOW (ref 135–146)
Total Bilirubin: 0.4 mg/dL (ref 0.2–1.2)
Total Protein: 6.5 g/dL (ref 6.1–8.1)
eGFR: 75 mL/min/{1.73_m2} (ref >=60)

## 2022-01-09 LAB — LIPID PANEL
Cholesterol: 169 mg/dL (ref <200)
HDL: 76 mg/dL (ref >=50)
LDL Cholesterol (Calc): 76 mg/dL (calc)
Non-HDL Cholesterol (Calc): 93 mg/dL (calc) (ref <130)
Total CHOL/HDL Ratio: 2.2 (calc) (ref <5.0)
Triglycerides: 90 mg/dL (ref <150)

## 2022-01-09 LAB — TSH: TSH: 2.84 mIU/L (ref 0.40–4.50)

## 2022-01-09 LAB — VITAMIN D 25 HYDROXY (VIT D DEFICIENCY, FRACTURES): Vit D, 25-Hydroxy: 44 ng/mL (ref 30–100)

## 2022-01-11 ENCOUNTER — Ambulatory Visit (INDEPENDENT_AMBULATORY_CARE_PROVIDER_SITE_OTHER): Payer: BC Managed Care – PPO | Admitting: Family Medicine

## 2022-01-11 ENCOUNTER — Encounter: Payer: Self-pay | Admitting: Family Medicine

## 2022-01-11 VITALS — BP 144/86 | HR 86 | Ht 62.0 in | Wt 113.4 lb

## 2022-01-11 DIAGNOSIS — Z23 Encounter for immunization: Secondary | ICD-10-CM | POA: Diagnosis not present

## 2022-01-11 DIAGNOSIS — L4050 Arthropathic psoriasis, unspecified: Secondary | ICD-10-CM | POA: Diagnosis not present

## 2022-01-11 DIAGNOSIS — M810 Age-related osteoporosis without current pathological fracture: Secondary | ICD-10-CM | POA: Diagnosis not present

## 2022-01-11 DIAGNOSIS — I10 Essential (primary) hypertension: Secondary | ICD-10-CM | POA: Diagnosis not present

## 2022-01-11 DIAGNOSIS — Z1231 Encounter for screening mammogram for malignant neoplasm of breast: Secondary | ICD-10-CM

## 2022-01-11 MED ORDER — OLMESARTAN MEDOXOMIL 40 MG PO TABS: 40.0000 mg | ORAL_TABLET | Freq: Every day | ORAL | 3 refills | Status: DC

## 2022-01-11 NOTE — Assessment & Plan Note (Addendum)
Well-controlled HTN AKI RESOLVED Cr stable  No known complications  Off Amlodipine 2.5   Plan:  1. DC Losartan 50mg  (finish remaining leftover pills on rx), Switch to Olmesartan 40mg  daily and continue Metoprolol XL 25mg  daily 2. Encourage improved lifestyle - low sodium diet, regular exercise 3. Continue monitor BP outside office, bring readings to next visit, if persistently >140/90 or new symptoms notify office sooner

## 2022-01-11 NOTE — Assessment & Plan Note (Signed)
Followed by [facility] Rheumatology Dr Nickola Major See A&P below

## 2022-01-11 NOTE — Patient Instructions (Addendum)
Thank you for coming to the office today.  Finish Losartan 50mg  daily  Switch to new ARB medication - Olmesartan 40mg  daily  ---------- [facility] [address] [place], [address] [place] Hours: M-Th 8-5pm / F 8-12noon Phone: [phone] Fax: [phone]  For Mammogram screening for breast cancer   Call the Imaging Center below anytime to schedule your own appointment now that order has been placed.  [facility] Outpatient Radiology [address] [place], [place] Phone: [phone]   Please schedule a Follow-up Appointment to: Return in about 6 months (around 07/12/2022) for 6 month follow-up HTN, Rheum updates.  If you have any other questions or concerns, please feel free to call the office or send a message through MyChart. You may also schedule an earlier appointment if necessary.  Additionally, you may be receiving a survey about your experience at our office within a few days to 1 week by e-mail or mail. We value your feedback.  [phone], DO [facility], Saralyn Pilar

## 2022-01-11 NOTE — Progress Notes (Signed)
Subjective:    Patient ID: Jane Sanchez, female    DOB: [birth year], 61 y.o.   MRN: [id number]  Jane Sanchez is a 61 y.o. female presenting on 01/11/2022 for Hypertension   HPI  CHRONIC HTN: Home BP readings 110-130 / 70-80 Current Meds - Losartan 39m daily, Metoprolol XL 230mdaily  needs 90 day orders Reports good compliance, took meds today. Tolerating well, w/o complaints. Denies CP, dyspnea, HA, edema, dizziness / lightheadedness   HYPERLIPIDEMIA: - Reports no concerns. Last lipid panel 12/2021 - Currently taking Rosuvastatin 1014m x weekly, tolerating well without side effects or myalgias Fam hx   Allergy induced Asthma Scents and smells perfumes trigger. Improving control asthma On Albuterol PRN   Chronic Back Pain W Sciatica Left sided only Osteoarthritis lumbar spine Admits muscle spasm and cramps. Already taking Gabapentin 300m6mD History of compression fracture x 3 in back. Back pain sciatica keeping her awake   Psoriatic Arthritis / Osteoporosis Switching from Cosyntex to Cimzia now but she developed significant rash on her legs due to Cimzia and she has come off of the Cimzia and she has apt with Rheumatology next week. - Vitamin D level is normal.  She would like a closer rheumatologist.  Fax lab results to [facility] [phone] Maintenance: Flu Shot today Future COVID when available.     06/19/2021   10:46 AM 12/15/2020   10:02 AM  Depression screen PHQ 2/9  Decreased Interest 0 0  Down, Depressed, Hopeless 0 0  PHQ - 2 Score 0 0  Altered sleeping 0 0  Tired, decreased energy 0 0  Change in appetite 0 0  Feeling bad or failure about yourself  0 0  Trouble concentrating 0 0  Moving slowly or fidgety/restless 0 0  Suicidal thoughts 0 0  PHQ-9 Score 0 0  Difficult doing work/chores Not difficult at all Not difficult at all    Social History    Tobacco Use   Smoking status: Never   Smokeless tobacco: Never  Substance Use Topics   Alcohol use: Yes    Alcohol/week: 2.0 standard drinks of alcohol    Types: 2 Glasses of wine per week    Review of Systems Per HPI unless specifically indicated above     Objective:    BP (!) 144/86 (BP Location: Left Arm, Cuff Size: Normal)   Pulse 86   Ht _0  (1.575 m)   Wt 113 lb 6.4 oz (51.4 kg)   SpO2 100%   BMI 20.74 kg/m   Wt Readings from Last 3 Encounters:  01/11/22 113 lb 6.4 oz (51.4 kg)  12/10/21 115 lb (52.2 kg)  11/27/21 114 lb (51.7 kg)    Physical Exam Vitals and nursing note reviewed.  Constitutional:      General: She is not in acute distress.    Appearance: Normal appearance. She is well-developed. She is not diaphoretic.     Comments: Well-appearing, comfortable, cooperative  HENT:     Head: Normocephalic and atraumatic.  Eyes:     General:        Right eye: No discharge.        Left eye: No discharge.     Conjunctiva/sclera: Conjunctivae normal.  Cardiovascular:     Rate and Rhythm: Normal rate.  Pulmonary:     Effort: Pulmonary effort is normal.  Skin:    General: Skin is warm and dry.  Findings: No erythema or rash.  Neurological:     Mental Status: She is alert and oriented to person, place, and time.  Psychiatric:        Mood and Affect: Mood normal.        Behavior: Behavior normal.        Thought Content: Thought content normal.     Comments: Well groomed, good eye contact, normal speech and thoughts       Results for orders placed or performed in visit on 01/07/22  TSH  Result Value Ref Range   TSH 2.84 0.40 - 4.50 mIU/L  VITAMIN D 25 Hydroxy (Vit-D Deficiency, Fractures)  Result Value Ref Range   Vit D, 25-Hydroxy 44 30 - 100 ng/mL  Lipid panel  Result Value Ref Range   Cholesterol 169 <200 mg/dL   HDL 76 > OR = 50 mg/dL   Triglycerides 90 <150 mg/dL   LDL Cholesterol (Calc) 76 mg/dL (calc)   Total CHOL/HDL Ratio 2.2 <5.0  (calc)   Non-HDL Cholesterol (Calc) 93 <130 mg/dL (calc)  COMPLETE METABOLIC PANEL WITH GFR  Result Value Ref Range   Glucose, Bld 84 65 - 99 mg/dL   BUN 7 7 - 25 mg/dL   Creat 0.87 0.50 - 1.05 mg/dL   eGFR 75 > OR = 60 mL/min/1.57m   BUN/Creatinine Ratio SEE NOTE: 6 - 22 (calc)   Sodium 132 (L) 135 - 146 mmol/L   Potassium 4.0 3.5 - 5.3 mmol/L   Chloride 98 98 - 110 mmol/L   CO2 26 20 - 32 mmol/L   Calcium 9.2 8.6 - 10.4 mg/dL   Total Protein 6.5 6.1 - 8.1 g/dL   Albumin 3.7 3.6 - 5.1 g/dL   Globulin 2.8 1.9 - 3.7 g/dL (calc)   AG Ratio 1.3 1.0 - 2.5 (calc)   Total Bilirubin 0.4 0.2 - 1.2 mg/dL   Alkaline phosphatase (APISO) 72 37 - 153 U/L   AST 17 10 - 35 U/L   ALT 11 6 - 29 U/L      Assessment & Plan:   Problem List Items Addressed This Visit     Essential hypertension - Primary    Well-controlled HTN AKI RESOLVED Cr stable  No known complications  Off Amlodipine 2.5   Plan:  1. DC Losartan 580m(finish remaining leftover pills on rx), Switch to Olmesartan 4030maily and continue Metoprolol XL 6m69mily 2. Encourage improved lifestyle - low sodium diet, regular exercise 3. Continue monitor BP outside office, bring readings to next visit, if persistently >140/90 or new symptoms notify office sooner      Relevant Medications   olmesartan (BENICAR) 40 MG tablet   Osteoporosis without current pathological fracture   Relevant Orders   Ambulatory referral to Rheumatology   Psoriatic arthritis (HCC)[place] [facility] Hawk[facility]udie Reed A&P below      Relevant Orders   Ambulatory referral to Rheumatology   Other Visit Diagnoses     Needs flu shot       Relevant Orders   Flu Vaccine QUAD 70mo+70moFluarix, Fluzone & Alfiuria Quad PF) (Completed)   Encounter for screening mammogram for malignant neoplasm of breast       Relevant Orders   MM 3D SCREEN BREAST BILATERAL        ---------- Psoriatric arthritis / Osteoporosis Managed by Dr [facility], on Cimzia previously now awaiting new treatment plan and on Prolia and she plans to switch to  local Rheumatology due to distance  New referral  Rheumatology [facility] [place], [place] [address] Hours: M-Th 8-5pm / F 8-12noon Phone: [phone] Fax: [phone]  For Mammogram screening for breast cancer   Call the [place] below anytime to schedule your own appointment now that order has been placed.  [facility] Outpatient Radiology [address] [place], [place] [address] Phone: [phone]   Orders Placed This Encounter  Procedures   MM 3D SCREEN BREAST BILATERAL    Standing Status:   Future    Standing Expiration Date:   01/12/2023    Order Specific Question:   Reason for Exam (SYMPTOM  OR DIAGNOSIS REQUIRED)    Answer:   Screening bilateral 3D Mammogram [place]    Order Specific Question:   Preferred imaging location?    Answer:   [facility]   Flu Vaccine QUAD 73moIM (Fluarix, Fluzone & Alfiuria Quad PF)   Ambulatory referral to Rheumatology    Referral Priority:   Routine    Referral Type:   Consultation    Referral Reason:   Specialty Services Required    Requested Specialty:   Rheumatology    Number of Visits Requested:   1     Meds ordered this encounter  Medications   olmesartan (BENICAR) 40 MG tablet    Sig: Take 1 tablet (40 mg total) by mouth daily.    Dispense:  90 tablet    Refill:  3    Switch from Losartan to Olmesartan      Follow up plan: Return in about 6 months (around 07/12/2022) for 6 month follow-up HTN, Rheum updates.   [facility]obie [facility]nam [facility] 01/11/2022, 10:59 AM

## 2022-02-11 ENCOUNTER — Ambulatory Visit
Admission: RE | Admit: 2022-02-11 | Discharge: 2022-02-11 | Disposition: A | Discharge status: Home / Self Care | Payer: BC Managed Care – PPO | Source: Ambulatory Visit | Attending: Family Medicine | Admitting: Family Medicine

## 2022-02-11 DIAGNOSIS — Z1231 Encounter for screening mammogram for malignant neoplasm of breast: Secondary | ICD-10-CM | POA: Insufficient documentation

## 2022-03-04 ENCOUNTER — Other Ambulatory Visit: Payer: Self-pay | Admitting: Family Medicine

## 2022-03-04 DIAGNOSIS — G629 Polyneuropathy, unspecified: Secondary | ICD-10-CM

## 2022-03-05 NOTE — Telephone Encounter (Signed)
Requested Prescriptions  Pending Prescriptions Disp Refills   gabapentin (NEURONTIN) 300 MG capsule [Pharmacy Med Name: GABAPENTIN 300 MG CAP] 270 capsule 1    Sig: TAKE 1 CAPSULE BY MOUTH 3 TIMES DAILY     Neurology: Anticonvulsants - gabapentin Passed - 03/04/2022 10:47 AM      Passed - Cr in normal range and within 360 days    Creat  Date Value Ref Range Status  01/08/2022 0.87 0.50 - 1.05 mg/dL Final         Passed - Completed PHQ-2 or PHQ-9 in the last 360 days      Passed - Valid encounter within last 12 months    Recent Outpatient Visits           1 month ago Essential hypertension   [place], DO   2 months ago AKI (acute kidney injury) [facility])   [place], DO   8 months ago Essential hypertension   [place], DO   11 months ago Mild intermittent asthma with exacerbation   [place], DO   1 year ago Psoriatic arthritis [facility])   [facility] [place], Devonne Doughty, DO       Future Appointments             In 4 months Parks Ranger, Devonne Doughty, DO [facility], [facility]

## 2022-03-14 ENCOUNTER — Telehealth: Payer: Self-pay

## 2022-03-14 NOTE — Telephone Encounter (Signed)
Nikki,  Could you re-fax / submit the referral for this patient?  Looks like it was sent on 01/14/22.  Thank you!  Saralyn Pilar, DO [facility] 03/14/2022, 1:59 PM

## 2022-03-14 NOTE — Telephone Encounter (Signed)
Copied from CRM [id number]. Topic: Referral - Status >> Mar 13, 2022  4:07 PM Macon Large wrote: Reason for CRM: Pt reports that she contacted the Rheumatologist office but they told her that they do not have a referral for her. Pt requests that the referral for Rheumatology be resubmitted.

## 2022-03-18 ENCOUNTER — Other Ambulatory Visit: Payer: Self-pay | Admitting: Internal Medicine

## 2022-03-18 DIAGNOSIS — B001 Herpesviral vesicular dermatitis: Secondary | ICD-10-CM

## 2022-03-18 NOTE — Telephone Encounter (Signed)
Requested Prescriptions  Pending Prescriptions Disp Refills   valACYclovir (VALTREX) 500 MG tablet [Pharmacy Med Name: VALACYCLOVIR HCL 500 MG TAB] 90 tablet 3    Sig: TAKE 1 TABLET BY MOUTH ONCE DAILY FOR SUPPRESSION     Antimicrobials:  Antiviral Agents - Anti-Herpetic Passed - 03/18/2022 10:39 AM      Passed - Valid encounter within last 12 months    Recent Outpatient Visits           2 months ago Essential hypertension   [facility] [place], Netta Neat, DO   3 months ago AKI (acute kidney injury) [facility])   [facility] Smitty Cords, DO   9 months ago Essential hypertension   [facility] Smitty Cords, DO   12 months ago Mild intermittent asthma with exacerbation   [facility] Smitty Cords, DO   1 year ago Psoriatic arthritis [facility])   [facility] Althea Charon, Netta Neat, DO       Future Appointments             In 3 months Althea Charon, Netta Neat, DO [facility], [facility]

## 2022-03-25 ENCOUNTER — Encounter (INDEPENDENT_AMBULATORY_CARE_PROVIDER_SITE_OTHER): Payer: BC Managed Care – PPO | Admitting: Family Medicine

## 2022-03-25 DIAGNOSIS — H1031 Unspecified acute conjunctivitis, right eye: Secondary | ICD-10-CM

## 2022-03-25 MED ORDER — POLYMYXIN B-TRIMETHOPRIM 10000-0.1 UNIT/ML-% OP SOLN: 1.0000 [drp] | Freq: Four times a day (QID) | OPHTHALMIC | 0 refills | Status: DC

## 2022-03-25 NOTE — Telephone Encounter (Signed)
Please see the MyChart message reply(ies) for my assessment and plan.    This patient gave consent for this Medical Advice Message and is aware that it may result in a bill to their insurance company, as well as the possibility of receiving a bill for a co-payment or deductible. They are an established patient, but are not seeking medical advice exclusively about a problem treated during an in person or video visit in the last seven days. I did not recommend an in person or video visit within seven days of my reply.    I spent a total of 7 minutes cumulative time within 7 days through MyChart messaging.  Javares Kaufhold, DO   

## 2022-04-02 ENCOUNTER — Other Ambulatory Visit: Payer: Self-pay | Admitting: Internal Medicine

## 2022-04-02 DIAGNOSIS — E782 Mixed hyperlipidemia: Secondary | ICD-10-CM

## 2022-04-02 DIAGNOSIS — G8929 Other chronic pain: Secondary | ICD-10-CM

## 2022-04-02 NOTE — Telephone Encounter (Signed)
Requested medication (s) are due for refill today:yes  Requested medication (s) are on the active medication list:yes  Last refill:  12/20/21  Future visit scheduled: yes  Notes to clinic:  Unable to refill per protocol, cannot delegate.      Requested Prescriptions  Pending Prescriptions Disp Refills   cyclobenzaprine (FLEXERIL) 10 MG tablet [Pharmacy Med Name: CYCLOBENZAPRINE HCL 10 MG TAB] 90 tablet 0    Sig: TAKE 1/2-1 TABLET BY MOUTH TWICE DAILY AS NEEDED FOR MUSCLE SPASMS     Not Delegated - Analgesics:  Muscle Relaxants Failed - 04/02/2022 10:53 AM      Failed - This refill cannot be delegated      Passed - Valid encounter within last 6 months    Recent Outpatient Visits           2 months ago Essential hypertension   [facility] [place], Netta Neat, DO   3 months ago AKI (acute kidney injury) [facility])   [facility] Smitty Cords, DO   9 months ago Essential hypertension   [facility] [place], Netta Neat, DO   1 year ago Mild intermittent asthma with exacerbation   [facility] Smitty Cords, DO   1 year ago Psoriatic arthritis [facility])   [facility], Netta Neat, DO       Future Appointments             In 3 months Althea Charon, Netta Neat, DO [facility], [facility]            Signed Prescriptions Disp Refills   rosuvastatin (CRESTOR) 10 MG tablet 36 tablet 0    Sig: TAKE 1 TABLET BY MOUTH THREE TIMES A WEEK     Cardiovascular:  Antilipid - Statins 2 Failed - 04/02/2022 10:53 AM      Failed - Lipid Panel in normal range within the last 12 months    Cholesterol  Date Value Ref Range Status  01/08/2022 169 <200 mg/dL Final   LDL Cholesterol (Calc)  Date Value Ref Range Status  01/08/2022 76 mg/dL (calc) Final    Comment:    Reference range: <100 . Desirable range <100 mg/dL for primary prevention;   <70 mg/dL for patients  with CHD or diabetic patients  with > or = 2 CHD risk factors. Marland Kitchen LDL-C is now calculated using the Martin-Hopkins  calculation, which is a validated novel method providing  better accuracy than the Friedewald equation in the  estimation of LDL-C.  Horald Pollen et al. Lenox Ahr. 2595;638(75): 2061-2068  (http://education.[employer].com/faq/FAQ164)    HDL  Date Value Ref Range Status  01/08/2022 76 > OR = 50 mg/dL Final   Triglycerides  Date Value Ref Range Status  01/08/2022 90 <150 mg/dL Final         Passed - Cr in normal range and within 360 days    Creat  Date Value Ref Range Status  01/08/2022 0.87 0.50 - 1.05 mg/dL Final         Passed - Patient is not pregnant      Passed - Valid encounter within last 12 months    Recent Outpatient Visits           2 months ago Essential hypertension   [facility] [place], Netta Neat, DO   3 months ago AKI (acute kidney injury) [facility])   [facility] Smitty Cords, DO   9 months ago  Essential hypertension   [facility] [place], Netta Neat, DO   1 year ago Mild intermittent asthma with exacerbation   [facility] Smitty Cords, DO   1 year ago Psoriatic arthritis [facility])   [facility] Althea Charon, Netta Neat, DO       Future Appointments             In 3 months Althea Charon, Netta Neat, DO [facility], [facility]

## 2022-04-02 NOTE — Telephone Encounter (Signed)
Requested Prescriptions  Pending Prescriptions Disp Refills   cyclobenzaprine (FLEXERIL) 10 MG tablet [Pharmacy Med Name: CYCLOBENZAPRINE HCL 10 MG TAB] 90 tablet 0    Sig: TAKE 1/2-1 TABLET BY MOUTH TWICE DAILY AS NEEDED FOR MUSCLE SPASMS     Not Delegated - Analgesics:  Muscle Relaxants Failed - 04/02/2022 10:53 AM      Failed - This refill cannot be delegated      Passed - Valid encounter within last 6 months    Recent Outpatient Visits           2 months ago Essential hypertension   [place], DO   3 months ago AKI (acute kidney injury) [facility])   [facility] Olin Hauser, DO   9 months ago Essential hypertension   [place], DO   1 year ago Mild intermittent asthma with exacerbation   [place], DO   1 year ago Psoriatic arthritis [facility])   [place], DO       Future Appointments             In 3 months [place], Devonne Doughty, DO [facility], [facility]             rosuvastatin (CRESTOR) 10 MG tablet [Pharmacy Med Name: ROSUVASTATIN CALCIUM 10 MG TAB] 36 tablet 0    Sig: TAKE 1 TABLET BY MOUTH THREE TIMES A WEEK     Cardiovascular:  Antilipid - Statins 2 Failed - 04/02/2022 10:53 AM      Failed - Lipid Panel in normal range within the last 12 months    Cholesterol  Date Value Ref Range Status  01/08/2022 169 <200 mg/dL Final   LDL Cholesterol (Calc)  Date Value Ref Range Status  01/08/2022 76 mg/dL (calc) Final    Comment:    Reference range: <100 . Desirable range <100 mg/dL for primary prevention;   <70 mg/dL for patients with CHD or diabetic patients  with > or = 2 CHD risk factors. Marland Kitchen LDL-C is now calculated using the Martin-Hopkins  calculation, which is a validated novel method providing  better accuracy than the Friedewald equation in the   estimation of LDL-C.  Cresenciano Genre et al. Annamaria Helling. MU:[id number]): 2061-2068  (http://education.[employer].com/faq/FAQ164)    HDL  Date Value Ref Range Status  01/08/2022 76 > OR = 50 mg/dL Final   Triglycerides  Date Value Ref Range Status  01/08/2022 90 <150 mg/dL Final         Passed - Cr in normal range and within 360 days    Creat  Date Value Ref Range Status  01/08/2022 0.87 0.50 - 1.05 mg/dL Final         Passed - Patient is not pregnant      Passed - Valid encounter within last 12 months    Recent Outpatient Visits           2 months ago Essential hypertension   [place], DO   3 months ago AKI (acute kidney injury) [facility])   [place], DO   9 months ago Essential hypertension   [place], DO   1 year ago Mild intermittent asthma with exacerbation   [place], DO   1 year ago Psoriatic arthritis ([place])  [facility] Althea Charon, Netta Neat, DO       Future Appointments             In 3 months Althea Charon, Netta Neat, DO [facility], [facility]

## 2022-06-20 ENCOUNTER — Other Ambulatory Visit: Payer: Self-pay

## 2022-06-20 DIAGNOSIS — I10 Essential (primary) hypertension: Secondary | ICD-10-CM

## 2022-06-20 MED ORDER — METOPROLOL SUCCINATE ER 25 MG PO TB24: 25.0000 mg | ORAL_TABLET | Freq: Every day | ORAL | 3 refills | Status: DC

## 2022-07-12 ENCOUNTER — Other Ambulatory Visit: Payer: Self-pay | Admitting: Family Medicine

## 2022-07-12 ENCOUNTER — Encounter: Payer: Self-pay | Admitting: Family Medicine

## 2022-07-12 ENCOUNTER — Ambulatory Visit: Payer: BC Managed Care – PPO | Admitting: Family Medicine

## 2022-07-12 VITALS — BP 126/78 | HR 79 | Ht 62.0 in | Wt 119.2 lb

## 2022-07-12 DIAGNOSIS — M810 Age-related osteoporosis without current pathological fracture: Secondary | ICD-10-CM | POA: Diagnosis not present

## 2022-07-12 DIAGNOSIS — R7309 Other abnormal glucose: Secondary | ICD-10-CM

## 2022-07-12 DIAGNOSIS — E782 Mixed hyperlipidemia: Secondary | ICD-10-CM

## 2022-07-12 DIAGNOSIS — L409 Psoriasis, unspecified: Secondary | ICD-10-CM | POA: Diagnosis not present

## 2022-07-12 DIAGNOSIS — I10 Essential (primary) hypertension: Secondary | ICD-10-CM | POA: Diagnosis not present

## 2022-07-12 DIAGNOSIS — L4050 Arthropathic psoriasis, unspecified: Secondary | ICD-10-CM

## 2022-07-12 DIAGNOSIS — E559 Vitamin D deficiency, unspecified: Secondary | ICD-10-CM

## 2022-07-12 DIAGNOSIS — Z0000 Encounter for general adult medical examination without abnormal findings: Secondary | ICD-10-CM

## 2022-07-12 NOTE — Assessment & Plan Note (Signed)
Followed by [facility] Rheumatology now in [place] and OP Awaiting authorization for Cimzia and Prolia

## 2022-07-12 NOTE — Assessment & Plan Note (Signed)
Well-controlled HYPERTENSION  No known complications  Off Amlodipine 2.5   Plan:  1. Continue Olmesartan 40mg  daily and continue Metoprolol XL 25mg  daily 2. Encourage improved lifestyle - low sodium diet, regular exercise 3. Continue monitor BP outside office, bring readings to next visit, if persistently >140/90 or new symptoms notify office sooner

## 2022-07-12 NOTE — Progress Notes (Signed)
Subjective:    Patient ID: Jane Sanchez, female    DOB: 06-04-1960, 62 y.o.   MRN: [id number]  Jane Sanchez is a 62 y.o. female presenting on 07/12/2022 for Hypertension and Arthritis   HPI  CHRONIC HTN: Home BP readings 110-130 / 70-80 Current Meds - Losartan 50mg  daily, Metoprolol XL 25mg  daily  needs 90 day orders Reports good compliance, took meds today. Tolerating well, w/o complaints. Denies CP, dyspnea, HA, edema, dizziness / lightheadedness   HYPERLIPIDEMIA: - Reports no concerns. Last lipid panel 12/2021 - Currently taking Rosuvastatin 10mg  3 x weekly, tolerating well without side effects or myalgias Fam hx   Allergy induced Asthma Scents and smells perfumes trigger. Improving control asthma On Albuterol PRN   Chronic Back Pain W Sciatica Left sided only Osteoarthritis lumbar spine Admits muscle spasm and cramps. Already taking Gabapentin 300mg  TID History of compression fracture x 3 in back. Back pain sciatica keeping her awake  Back pain improved Finished PT in October Doing home PT exercises and stretches to help   Psoriatic Arthritis / Osteoporosis Switching from Cosyntex to Cimzia now but she developed significant rash on her legs due to Cimzia and she has come off of the Cimzia and she has apt with Rheumatology next week. - Vitamin D level is normal.  Waiting on Cimzia and Prolia approval  Psoriasis [facility] - topical clobetasol        07/12/2022   11:01 AM 06/19/2021   10:46 AM 12/15/2020   10:02 AM  Depression screen PHQ 2/9  Decreased Interest 0 0 0  Down, Depressed, Hopeless 0 0 0  PHQ - 2 Score 0 0 0  Altered sleeping 0 0 0  Tired, decreased energy 0 0 0  Change in appetite 0 0 0  Feeling bad or failure about yourself  0 0 0  Trouble concentrating 0 0 0  Moving slowly or fidgety/restless 0 0 0  Suicidal thoughts 0 0 0  PHQ-9 Score 0 0 0  Difficult doing work/chores Not difficult at all Not difficult at  all Not difficult at all    Social History   Tobacco Use   Smoking status: Never   Smokeless tobacco: Never  Substance Use Topics   Alcohol use: Yes    Alcohol/week: 2.0 standard drinks of alcohol    Types: 2 Glasses of wine per week    Review of Systems Per HPI unless specifically indicated above     Objective:    BP 126/78   Pulse 79   Ht 5\' 2"  (1.575 m)   Wt 119 lb 3.2 oz (54.1 kg)   SpO2 100%   BMI 21.80 kg/m   Wt Readings from Last 3 Encounters:  07/12/22 119 lb 3.2 oz (54.1 kg)  01/11/22 113 lb 6.4 oz (51.4 kg)  12/10/21 115 lb (52.2 kg)    Physical Exam Vitals and nursing note reviewed.  Constitutional:      General: She is not in acute distress.    Appearance: Normal appearance. She is well-developed. She is not diaphoretic.     Comments: Well-appearing, comfortable, cooperative  HENT:     Head: Normocephalic and atraumatic.  Eyes:     General:        Right eye: No discharge.        Left eye: No discharge.     Conjunctiva/sclera: Conjunctivae normal.  Cardiovascular:     Rate and Rhythm: Normal rate.  Pulmonary:     Effort: Pulmonary effort  is normal.  Skin:    General: Skin is warm and dry.     Findings: No erythema or rash.  Neurological:     Mental Status: She is alert and oriented to person, place, and time.  Psychiatric:        Mood and Affect: Mood normal.        Behavior: Behavior normal.        Thought Content: Thought content normal.     Comments: Well groomed, good eye contact, normal speech and thoughts       Results for orders placed or performed in visit on 01/07/22  TSH  Result Value Ref Range   TSH 2.84 0.40 - 4.50 mIU/L  VITAMIN D 25 Hydroxy (Vit-D Deficiency, Fractures)  Result Value Ref Range   Vit D, 25-Hydroxy 44 30 - 100 ng/mL  Lipid panel  Result Value Ref Range   Cholesterol 169 <200 mg/dL   HDL 76 > OR = 50 mg/dL   Triglycerides 90 <150 mg/dL   LDL Cholesterol (Calc) 76 mg/dL (calc)   Total CHOL/HDL Ratio 2.2  <5.0 (calc)   Non-HDL Cholesterol (Calc) 93 <130 mg/dL (calc)  COMPLETE METABOLIC PANEL WITH GFR  Result Value Ref Range   Glucose, Bld 84 65 - 99 mg/dL   BUN 7 7 - 25 mg/dL   Creat 0.87 0.50 - 1.05 mg/dL   eGFR 75 > OR = 60 mL/min/1.45m2   BUN/Creatinine Ratio SEE NOTE: 6 - 22 (calc)   Sodium 132 (L) 135 - 146 mmol/L   Potassium 4.0 3.5 - 5.3 mmol/L   Chloride 98 98 - 110 mmol/L   CO2 26 20 - 32 mmol/L   Calcium 9.2 8.6 - 10.4 mg/dL   Total Protein 6.5 6.1 - 8.1 g/dL   Albumin 3.7 3.6 - 5.1 g/dL   Globulin 2.8 1.9 - 3.7 g/dL (calc)   AG Ratio 1.3 1.0 - 2.5 (calc)   Total Bilirubin 0.4 0.2 - 1.2 mg/dL   Alkaline phosphatase (APISO) 72 37 - 153 U/L   AST 17 10 - 35 U/L   ALT 11 6 - 29 U/L      Assessment & Plan:   Problem List Items Addressed This Visit     Essential hypertension - Primary    Well-controlled HYPERTENSION  No known complications  Off Amlodipine 2.5   Plan:  1. Continue Olmesartan 40mg  daily and continue Metoprolol XL 25mg  daily 2. Encourage improved lifestyle - low sodium diet, regular exercise 3. Continue monitor BP outside office, bring readings to next visit, if persistently >140/90 or new symptoms notify office sooner      Relevant Medications   EPINEPHrine 0.3 mg/0.3 mL IJ SOAJ injection   Osteoporosis without current pathological fracture   Psoriatic arthritis ([place])    Followed by [facility] now in [place] and OP Awaiting authorization for Cimzia and Prolia      Other Visit Diagnoses     Psoriasis           Keep on current therapy, no changes today. Continue with current specialists including [facility]  Continue w/ Dermatology for management of Psoriasis plaque skin involvement On CLobetasol topical  We will check on the blood work in 6 months, hopefully it does not interfere with theirs, if you have labs at the same time frame - let me know in advance.  No orders of the defined types were placed in  this encounter.    Follow up plan: Return in  about 6 months (around 01/12/2023) for 6 month fasting lab only then 1 week later Annual Physical.  Future labs ordered for  12/2022 order all labs   Nobie Putnam, [facility] 07/12/2022, 10:47 AM

## 2022-07-12 NOTE — Patient Instructions (Addendum)
Thank you for coming to the office today.  Keep on current therapy, no changes today.  Continue with current specialists including [facility]  We will check on the blood work in 6 months, hopefully it does not interfere with theirs, if you have labs at the same time frame - let me know in advance.  DUE for FASTING BLOOD WORK (no food or drink after midnight before the lab appointment, only water or coffee without cream/sugar on the morning of)  SCHEDULE "Lab Only" visit in the morning at the clinic for lab draw in 6 MONTHS   - Make sure Lab Only appointment is at about 1 week before your next appointment, so that results will be available  For Lab Results, once available within 2-3 days of blood draw, you can can log in to MyChart online to view your results and a brief explanation. Also, we can discuss results at next follow-up visit.   Please schedule a Follow-up Appointment to: Return in about 6 months (around 01/12/2023) for 6 month fasting lab only then 1 week later Annual Physical.  If you have any other questions or concerns, please feel free to call the office or send a message through [place]. You may also schedule an earlier appointment if necessary.  Additionally, you may be receiving a survey about your experience at our office within a few days to 1 week by e-mail or mail. We value your feedback.  Nobie Putnam, DO [place]

## 2022-07-25 ENCOUNTER — Other Ambulatory Visit: Payer: Self-pay | Admitting: Family Medicine

## 2022-07-25 ENCOUNTER — Other Ambulatory Visit: Payer: Self-pay | Admitting: Internal Medicine

## 2022-07-25 DIAGNOSIS — G629 Polyneuropathy, unspecified: Secondary | ICD-10-CM

## 2022-07-25 DIAGNOSIS — E782 Mixed hyperlipidemia: Secondary | ICD-10-CM

## 2022-07-25 NOTE — Telephone Encounter (Signed)
Requested Prescriptions  Pending Prescriptions Disp Refills   rosuvastatin (CRESTOR) 10 MG tablet [Pharmacy Med Name: ROSUVASTATIN CALCIUM 10 MG TAB] 36 tablet 1    Sig: TAKE 1 TABLET BY MOUTH THREE TIMES A WEEK     Cardiovascular:  Antilipid - Statins 2 Failed - 07/25/2022 12:13 PM      Failed - Lipid Panel in normal range within the last 12 months    Cholesterol  Date Value Ref Range Status  01/08/2022 169 <200 mg/dL Final   LDL Cholesterol (Calc)  Date Value Ref Range Status  01/08/2022 76 mg/dL (calc) Final    Comment:    Reference range: <100 . Desirable range <100 mg/dL for primary prevention;   <70 mg/dL for patients with CHD or diabetic patients  with > or = 2 CHD risk factors. Marland Kitchen LDL-C is now calculated using the Martin-Hopkins  calculation, which is a validated novel method providing  better accuracy than the Friedewald equation in the  estimation of LDL-C.  Cresenciano Genre et al. Annamaria Helling. [id number]): 2061-2068  (http://education.[employer].com/faq/FAQ164)    HDL  Date Value Ref Range Status  01/08/2022 76 > OR = 50 mg/dL Final   Triglycerides  Date Value Ref Range Status  01/08/2022 90 <150 mg/dL Final         Passed - Cr in normal range and within 360 days    Creat  Date Value Ref Range Status  01/08/2022 0.87 0.50 - 1.05 mg/dL Final         Passed - Patient is not pregnant      Passed - Valid encounter within last 12 months    Recent Outpatient Visits           1 week ago Essential hypertension   [place], DO   6 months ago Essential hypertension   [place], DO   7 months ago AKI (acute kidney injury) [facility])   [place], DO   1 year ago Essential hypertension   [place], DO   1 year ago Mild intermittent asthma with exacerbation    [place], DO       Future Appointments             In 6 months [place] Ranger, Devonne Doughty, [facility], [facility]

## 2022-07-25 NOTE — Telephone Encounter (Signed)
Requested Prescriptions  Refused Prescriptions Disp Refills   gabapentin (NEURONTIN) 300 MG capsule [Pharmacy Med Name: GABAPENTIN 300 MG CAP] 270 capsule 1    Sig: TAKE 1 CAPSULE BY MOUTH 3 TIMES DAILY     Neurology: Anticonvulsants - gabapentin Passed - 07/25/2022 12:13 PM      Passed - Cr in normal range and within 360 days    Creat  Date Value Ref Range Status  01/08/2022 0.87 0.50 - 1.05 mg/dL Final         Passed - Completed PHQ-2 or PHQ-9 in the last 360 days      Passed - Valid encounter within last 12 months    Recent Outpatient Visits           1 week ago Essential hypertension   [place], DO   6 months ago Essential hypertension   [place], DO   7 months ago AKI (acute kidney injury) [facility])   [place], DO   1 year ago Essential hypertension   [place], DO   1 year ago Mild intermittent asthma with exacerbation   [place], DO       Future Appointments             In 6 months Parks Ranger, Devonne Doughty, DO [facility], [facility]

## 2022-09-20 ENCOUNTER — Other Ambulatory Visit: Payer: Self-pay | Admitting: Family Medicine

## 2022-09-20 DIAGNOSIS — G8929 Other chronic pain: Secondary | ICD-10-CM

## 2022-09-20 NOTE — Telephone Encounter (Signed)
Requested medication (s) are due for refill today:   Provider to review  Requested medication (s) are on the active medication list:   Yes  Future visit scheduled:   Yes   Last ordered: 04/02/2022 #90, 2 refills  Non delegated refill    Requested Prescriptions  Pending Prescriptions Disp Refills   cyclobenzaprine (FLEXERIL) 10 MG tablet [Pharmacy Med Name: CYCLOBENZAPRINE HCL 10 MG TAB] 90 tablet 2    Sig: TAKE 1/2-1 TABLET BY MOUTH TWICE DAILY AS NEEDED FOR MUSCLE SPASMS     Not Delegated - Analgesics:  Muscle Relaxants Failed - 09/20/2022 11:18 AM      Failed - This refill cannot be delegated      Passed - Valid encounter within last 6 months    Recent Outpatient Visits           2 months ago Essential hypertension   [place] [facility] Smitty Cords, DO   8 months ago Essential hypertension   [facility] Smitty Cords, DO   9 months ago AKI (acute kidney injury) [facility])   [place] [facility] Smitty Cords, DO   1 year ago Essential hypertension   [place] [facility] Smitty Cords, DO   1 year ago Mild intermittent asthma with exacerbation   [place] [facility] Althea Charon, Netta Neat, DO       Future Appointments             In 4 months Althea Charon, Netta Neat, DO [place] [facility], [facility]

## 2022-10-21 IMAGING — MG MM DIGITAL SCREENING BILAT W/ TOMO AND CAD
8 series · 9 of 24 positions shown · non-contrast
Comparison: Previous exam(s).

CLINICAL DATA: Screening.

EXAM:
DIGITAL SCREENING BILATERAL MAMMOGRAM WITH TOMOSYNTHESIS AND CAD
TECHNIQUE: Bilateral screening digital craniocaudal and mediolateral oblique
mammograms were obtained. Bilateral screening digital breast
tomosynthesis was performed. The images were evaluated with
computer-aided detection.

[R CC synth-2D]
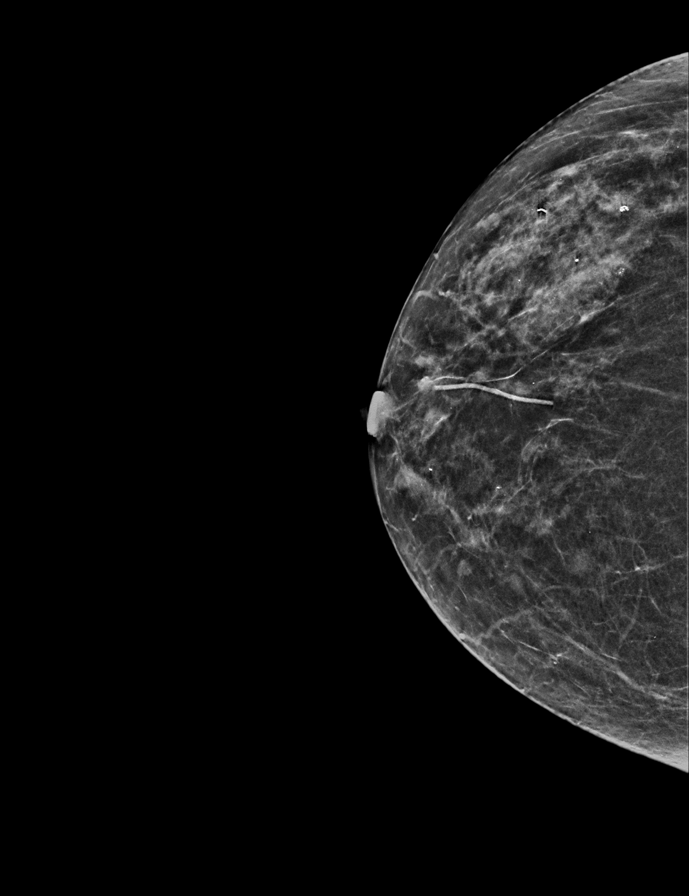

[R MLO synth-2D]
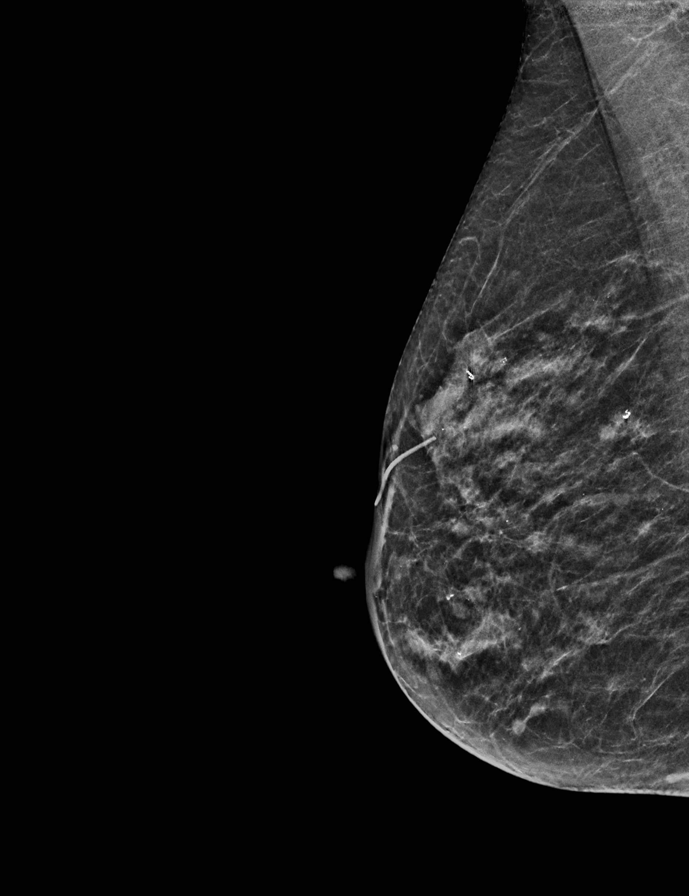

[L MLO synth-2D]
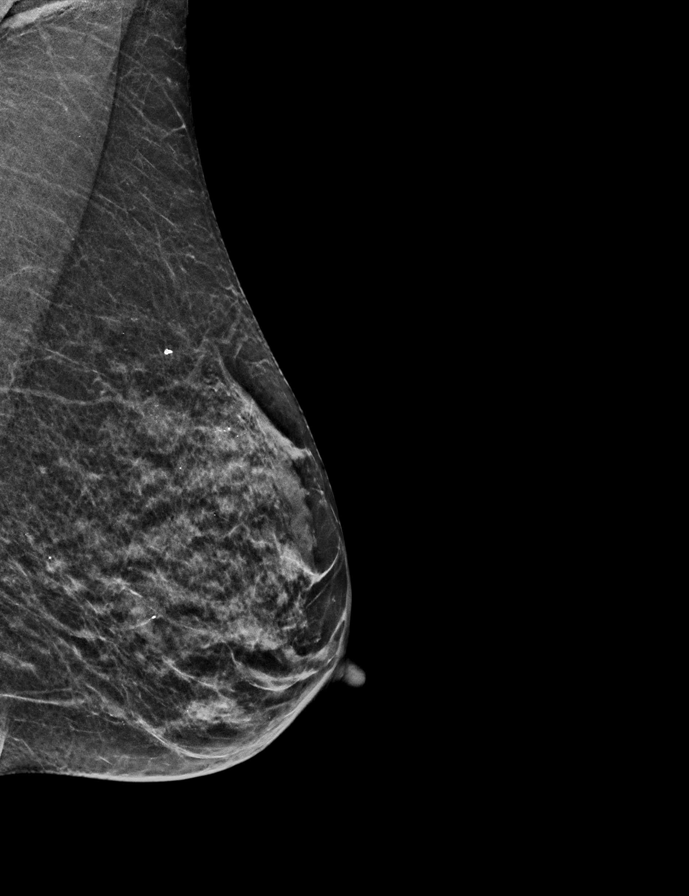

[L CC synth-2D]
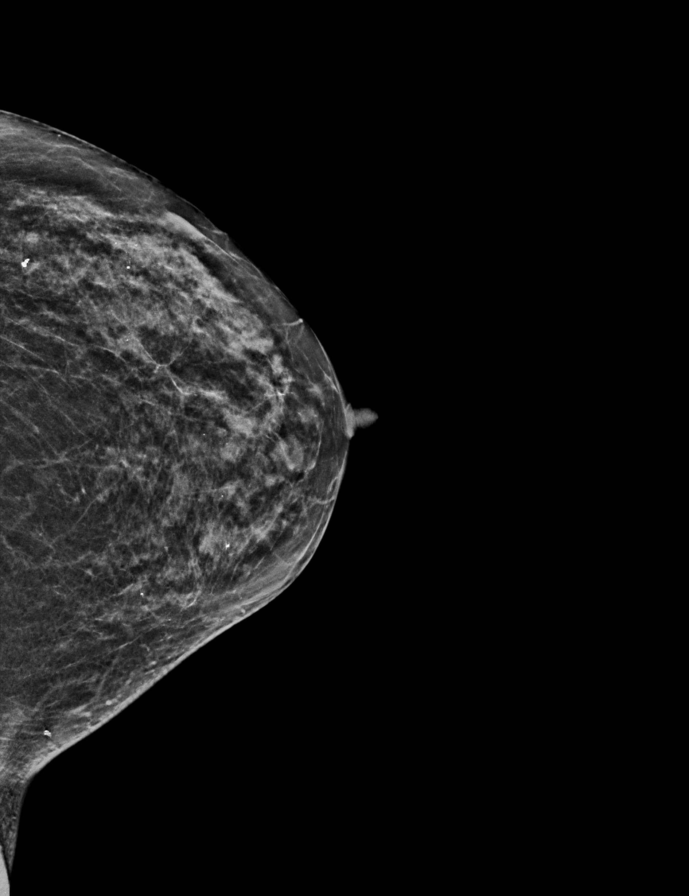

[R CC tomo · 2 of 47 frames shown]
[frame 16/47]
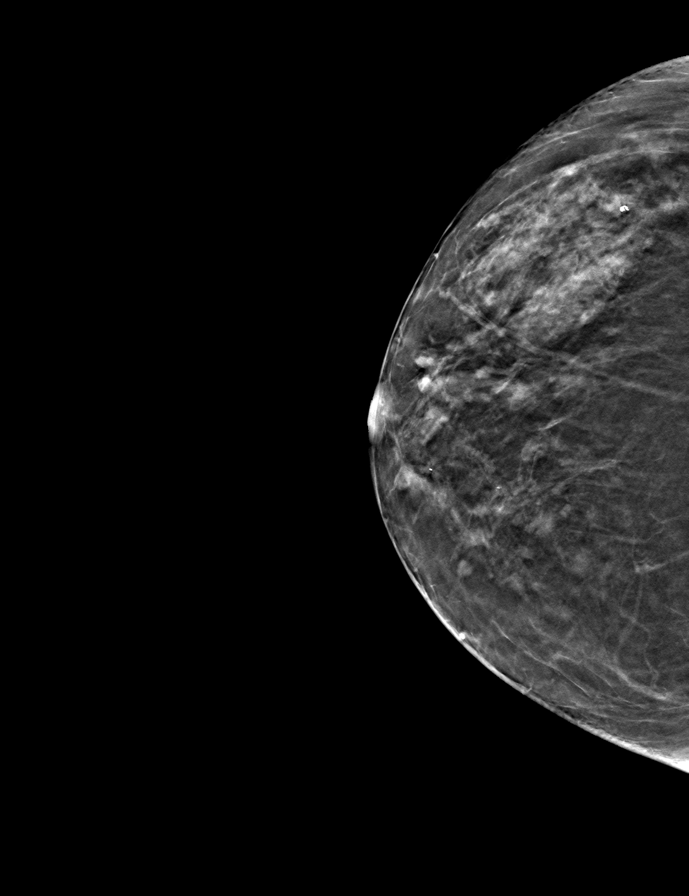
[frame 24/47]
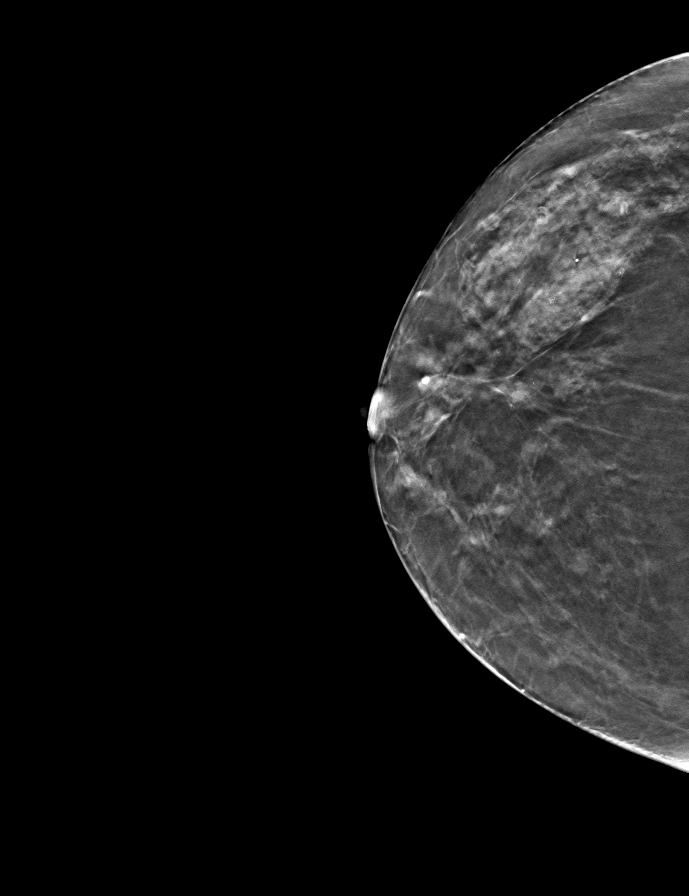

[R MLO tomo · tomo slice 23/45.0]
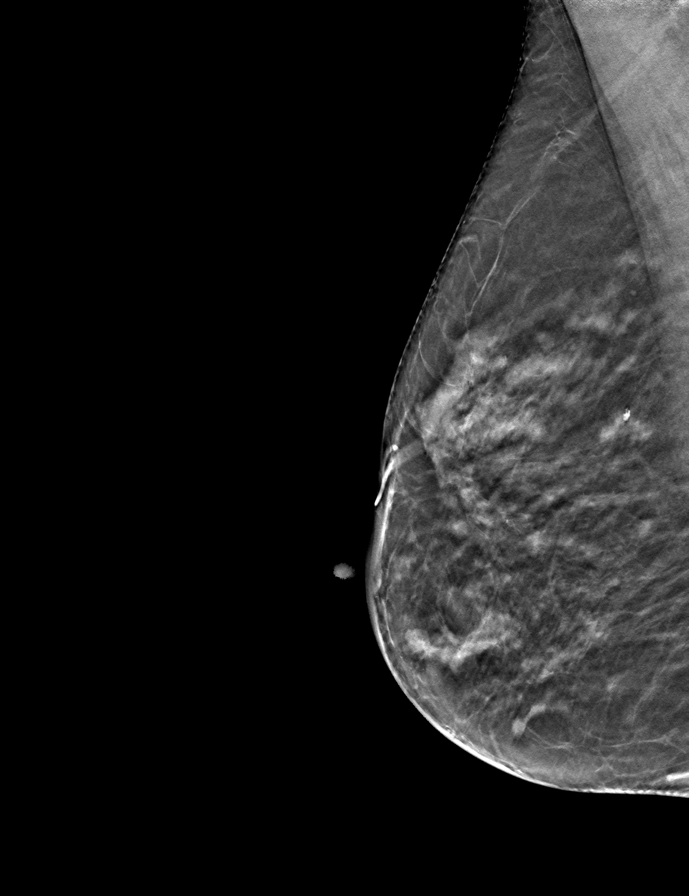

[L CC tomo · tomo slice 22/43.0]
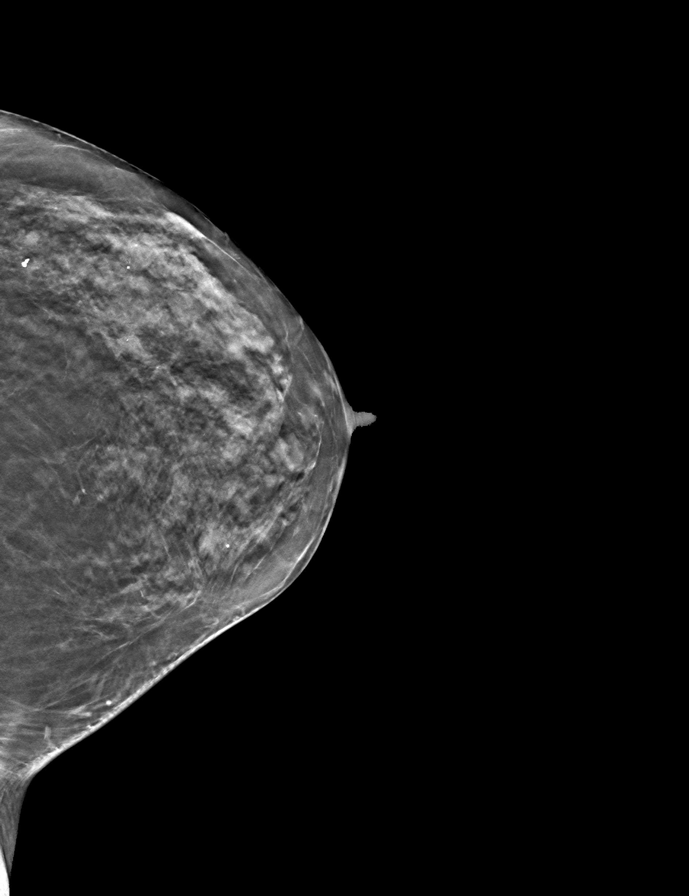

[L MLO tomo · tomo slice 22/43.0]
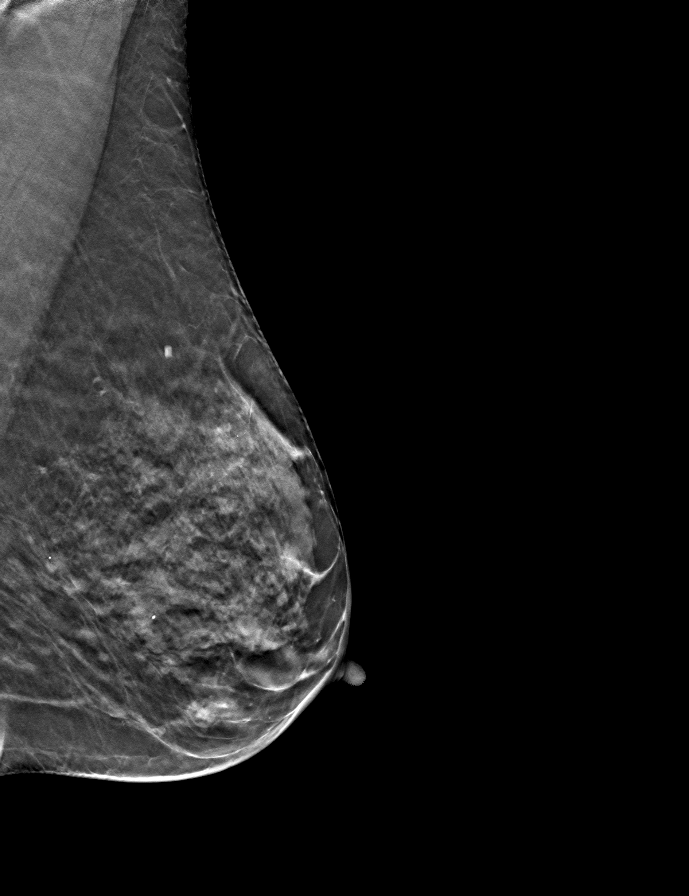

[9 of 24 positions shown; findings below may reference images not displayed]

ACR Breast Density Category c: The breast tissue is heterogeneously
dense, which may obscure small masses.
FINDINGS: In the left breast, a possible mass with calcifications warrants
further evaluation. This possible mass with calcifications is seen
within the outer LEFT breast, cc slice 21 and MLO slice 35.

In the right breast, no findings suspicious for malignancy.
IMPRESSION: Further evaluation is suggested for a possible mass in the left
breast.

RECOMMENDATION:
Diagnostic mammogram, including magnification views for the LEFT
breast calcifications, and possibly ultrasound of the left breast.
(Code:[id number])

The patient will be contacted regarding the findings, and additional
imaging will be scheduled.

BI-RADS CATEGORY  0: Incomplete. Need additional imaging evaluation
and/or prior mammograms for comparison.

## 2022-11-19 ENCOUNTER — Encounter: Payer: Self-pay | Admitting: Ophthalmology

## 2022-11-20 ENCOUNTER — Encounter: Payer: Self-pay | Admitting: Anesthesiology

## 2022-12-23 ENCOUNTER — Other Ambulatory Visit: Payer: Self-pay | Admitting: Family Medicine

## 2022-12-23 DIAGNOSIS — G629 Polyneuropathy, unspecified: Secondary | ICD-10-CM

## 2022-12-24 NOTE — Telephone Encounter (Signed)
Requested Prescriptions  Pending Prescriptions Disp Refills   gabapentin (NEURONTIN) 300 MG capsule [Pharmacy Med Name: GABAPENTIN 300 MG CAP] 270 capsule 1    Sig: TAKE 1 CAPSULE BY MOUTH 3 TIMES DAILY     Neurology: Anticonvulsants - gabapentin Passed - 12/23/2022  5:24 PM      Passed - Cr in normal range and within 360 days    Creat  Date Value Ref Range Status  01/08/2022 0.87 0.50 - 1.05 mg/dL Final         Passed - Completed PHQ-2 or PHQ-9 in the last 360 days      Passed - Valid encounter within last 12 months    Recent Outpatient Visits           5 months ago Essential hypertension   [place] [facility] Smitty Cords, DO   11 months ago Essential hypertension   [place] [facility] Smitty Cords, DO   1 year ago AKI (acute kidney injury) [facility])   [place] [facility] Smitty Cords, DO   1 year ago Essential hypertension   [place]  [facility] Smitty Cords, DO   1 year ago Mild intermittent asthma with exacerbation   [place] [facility] Smitty Cords, DO       Future Appointments             In 1 month Althea Charon, Netta Neat, DO [place] [facility] [place], [facility]

## 2023-01-02 ENCOUNTER — Ambulatory Visit: Admission: RE | Admit: 2023-01-02 | Payer: BC Managed Care – PPO | Source: Ambulatory Visit | Admitting: Ophthalmology

## 2023-01-02 SURGERY — CATARACT EXTRACTION PHACO AND INTRAOCULAR LENS PLACEMENT (IOC)
Anesthesia: Topical | Laterality: Left

## 2023-01-08 ENCOUNTER — Other Ambulatory Visit: Payer: Self-pay | Admitting: Internal Medicine

## 2023-01-08 DIAGNOSIS — G8929 Other chronic pain: Secondary | ICD-10-CM

## 2023-01-09 NOTE — Telephone Encounter (Signed)
Requested medication (s) are due for refill today: yes  Requested medication (s) are on the active medication list: yes    Last refill: 09/20/22  #90  0 refills  Future visit scheduled yes 01/31/23  Notes to clinic:Not delegated, please review. Thank you.  Requested Prescriptions  Pending Prescriptions Disp Refills   cyclobenzaprine (FLEXERIL) 10 MG tablet [Pharmacy Med Name: CYCLOBENZAPRINE HCL 10 MG TAB] 90 tablet 0    Sig: TAKE 1/2-1 TABLET BY MOUTH TWICE DAILY AS NEEDED FOR MUSCLE SPASMS     Not Delegated - Analgesics:  Muscle Relaxants Failed - 01/08/2023 10:28 AM      Failed - This refill cannot be delegated      Passed - Valid encounter within last 6 months    Recent Outpatient Visits           6 months ago Essential hypertension   [place] [facility] Smitty Cords, DO   12 months ago Essential hypertension   [place] [id number] Smitty Cords, DO   1 year ago AKI (acute kidney injury) [facility])   [place] [facility] Smitty Cords, DO   1 year ago Essential hypertension   [place] [facility] Smitty Cords, DO   1 year ago Mild intermittent asthma with exacerbation   [place] [facility] Smitty Cords, DO       Future Appointments             In 3 weeks Althea Charon, Netta Neat, DO [place] [facility], [facility]

## 2023-01-17 ENCOUNTER — Other Ambulatory Visit: Payer: BC Managed Care – PPO

## 2023-01-18 ENCOUNTER — Ambulatory Visit
Admission: RE | Admit: 2023-01-18 | Discharge: 2023-01-18 | Disposition: A | Discharge status: Home / Self Care | Payer: BC Managed Care – PPO | Source: Ambulatory Visit

## 2023-01-18 VITALS — BP 141/93 | HR 77 | Temp 98.3°F | Resp 14 | Ht 62.0 in | Wt 117.9 lb

## 2023-01-18 DIAGNOSIS — L0291 Cutaneous abscess, unspecified: Secondary | ICD-10-CM

## 2023-01-18 MED ORDER — SULFAMETHOXAZOLE-TRIMETHOPRIM 800-160 MG PO TABS: 1.0000 | ORAL_TABLET | Freq: Two times a day (BID) | ORAL | 0 refills | Status: AC

## 2023-01-18 NOTE — ED Triage Notes (Signed)
Patient c/o abscess in her right groin about a week ago.  Patient reports yesterday the area got redder and had some drainage from it.  Patient denies fevers.

## 2023-01-18 NOTE — Discharge Instructions (Addendum)
Take antibiotics until completed.  Follow-up with PCP if no improvement.  Continue warm compresses 3 times a day.  Continue changing bandages 3 times a day, keep area clean, use and antibacterial soap.

## 2023-01-18 NOTE — ED Provider Notes (Signed)
[facility]    CSN: [id number] Arrival date & time: 01/18/23  1102      History   Chief Complaint Chief Complaint  Patient presents with   Abscess    Appointment    HPI Jane Sanchez is a 62 y.o. female.    Abscess Abscess location: Right lateral to suprapubic area. Size:  5 cm x 2 cm Abscess quality: draining   Duration:  3 days Progression:  Worsening Chronicity:  New Relieved by:  Nothing Ineffective treatments:  Warm compresses   Past Medical History:  Diagnosis Date   Arthritis    Asthma    Hyperlipidemia    Hypertension    Osteoporosis     Patient Active Problem List   Diagnosis Date Noted   Hyponatremia 11/29/2021   Hypokalemia 11/29/2021   Asthma, chronic 11/28/2021   Dyslipidemia 11/28/2021   Chronic left-sided low back pain with left-sided sciatica 06/19/2021   Essential hypertension 03/19/2021   Psoriatic arthritis (HCC) 12/15/2020   Mixed hyperlipidemia 12/15/2020   Osteoporosis without current pathological fracture 12/15/2020   Neuropathy 12/15/2020   Cold sore 12/15/2020    Past Surgical History:  Procedure Laterality Date   BREAST BIOPSY Bilateral    benign cysts   BREAST EXCISIONAL BIOPSY Left    BREAST EXCISIONAL BIOPSY Right    CESAREAN SECTION  1986   1989, 1991   KNEE SURGERY Left 1983   MENISCECTOMY Right 2011   TOTAL ABDOMINAL HYSTERECTOMY  2001   removal of cervix as well    OB History   No obstetric history on file.      Home Medications    Prior to Admission medications   Medication Sig Start Date End Date Taking? Authorizing Provider  albuterol (VENTOLIN HFA) 108 (90 Base) MCG/ACT inhaler Inhale 1-2 puffs into the lungs every 4 (four) hours as needed for wheezing or shortness of breath. 03/19/21  Yes Karamalegos, Netta Neat, DO  CIMZIA 2 X 200 MG/ML PSKT Inject into the skin. 11/09/21  Yes [provider]  clobetasol ointment (TEMOVATE) 0.05 % Apply 1 Application topically 2 (two) times  daily.   Yes [provider]  cyclobenzaprine (FLEXERIL) 10 MG tablet TAKE 1/2-1 TABLET BY MOUTH TWICE DAILY AS NEEDED FOR MUSCLE SPASMS 01/09/23  Yes Lorre Munroe, NP  denosumab (PROLIA) 60 MG/ML SOSY injection Inject 60 mg into the skin every 6 (six) months.   Yes [provider]  gabapentin (NEURONTIN) 300 MG capsule TAKE 1 CAPSULE BY MOUTH 3 TIMES DAILY 12/24/22  Yes Karamalegos, Netta Neat, DO  metoprolol succinate (TOPROL-XL) 25 MG 24 hr tablet Take 1 tablet (25 mg total) by mouth daily. 06/20/22  Yes Karamalegos, Netta Neat, DO  sulfamethoxazole-trimethoprim (BACTRIM DS) 800-160 MG tablet Take 1 tablet by mouth 2 (two) times daily for 7 days. 01/18/23 01/25/23 Yes Vaniya Augspurger, Linde Gillis, NP  VEVYE 0.1 % SOLN Apply 1 drop to eye 2 (two) times daily. 01/07/23  Yes [provider]  albuterol (PROVENTIL) (2.5 MG/3ML) 0.083% nebulizer solution Take 3 mLs (2.5 mg total) by nebulization every 6 (six) hours as needed for wheezing or shortness of breath. Patient not taking: Reported on 11/19/2022 03/19/21   Smitty Cords, DO  calcium carbonate (TUMS - DOSED IN MG ELEMENTAL CALCIUM) 500 MG chewable tablet Chew 1 tablet (200 mg of elemental calcium total) by mouth 2 (two) times daily. Patient not taking: Reported on 11/19/2022 12/02/21   Lynn Ito, MD  EPINEPHrine 0.3 mg/0.3 mL IJ SOAJ injection  11/01/11   [provider]  rosuvastatin (CRESTOR) 10 MG tablet TAKE 1 TABLET BY MOUTH THREE TIMES A WEEK 07/25/22   Karamalegos, Netta Neat, DO  traZODone (DESYREL) 50 MG tablet Take 0.5-1 tablets (25-50 mg total) by mouth at bedtime as needed for sleep. Patient not taking: Reported on 11/19/2022 12/10/21   Smitty Cords, DO  valACYclovir (VALTREX) 500 MG tablet TAKE 1 TABLET BY MOUTH ONCE DAILY FOR SUPPRESSION 03/18/22   Althea Charon Netta Neat, DO    Family History Family History  Problem Relation Age of Onset   Breast cancer Mother    Bladder Cancer Mother     Breast cancer Maternal Aunt     Social History Social History   Tobacco Use   Smoking status: Never   Smokeless tobacco: Never  Vaping Use   Vaping status: Never Used  Substance Use Topics   Alcohol use: Yes    Alcohol/week: 2.0 standard drinks of alcohol    Types: 2 Glasses of wine per week   Drug use: Never     Allergies   Calcitonin, Cephalosporins, Bee venom, Cephalexin, and Adalimumab   Review of Systems Review of Systems  Skin:  Positive for wound.     Physical Exam Triage Vital Signs ED Triage Vitals  Encounter Vitals Group     BP 01/18/23 1121 (!) 141/93     Systolic BP Percentile --      Diastolic BP Percentile --      Pulse Rate 01/18/23 1121 77     Resp 01/18/23 1121 14     Temp 01/18/23 1121 98.3 F (36.8 C)     Temp Source 01/18/23 1121 Oral     SpO2 01/18/23 1121 100 %     Weight 01/18/23 1117 117 lb 15.1 oz (53.5 kg)     Height 01/18/23 1117 5\' 2"  (1.575 m)     Head Circumference --      Peak Flow --      Pain Score 01/18/23 1117 3     Pain Loc --      Pain Education --      Exclude from Growth Chart --    No data found.  Updated Vital Signs BP (!) 141/93 (BP Location: Left Arm)   Pulse 77   Temp 98.3 F (36.8 C) (Oral)   Resp 14   Ht 5\' 2"  (1.575 m)   Wt 117 lb 15.1 oz (53.5 kg)   SpO2 100%   BMI 21.57 kg/m   Visual Acuity Right Eye Distance:   Left Eye Distance:   Bilateral Distance:    Right Eye Near:   Left Eye Near:    Bilateral Near:     Physical Exam Skin:    Comments: Draining abscess to the right lateral suprapubic area.  Drainage is purulent yellow.  Erythema measures 5 cm x 2 cm      UC Treatments / Results  Labs (all labs ordered are listed, but only abnormal results are displayed) Labs Reviewed - No data to display  EKG   Radiology No results found.  Procedures Procedures (including critical care time)  Medications Ordered in UC Medications - No data to display  Initial Impression /  Assessment and Plan / UC Course  I have reviewed the triage vital signs and the nursing notes.  Pertinent labs & imaging results that were available during my care of the patient were reviewed by me and considered in my medical decision making (see chart for details).  Abscess to the right lateral suprapubic area.  Abscess is open and draining yellow purulent.  No fevers or other signs of sepsis at this time.  Vital signs are stable.  We will order Bactrim and recommend continuing warm compresses 3 times a day along with antibacterial soap. Red flag symptoms of increasing redness, fever, increased pain reviewed with patient.  She will follow-up with PCP for worsening symptoms.   Final Clinical Impressions(s) / UC Diagnoses   Final diagnoses:  Abscess     Discharge Instructions      Take antibiotics until completed.  Follow-up with PCP if no improvement.  Continue warm compresses 3 times a day.  Continue changing bandages 3 times a day, keep area clean, use and antibacterial soap.     ED Prescriptions     Medication Sig Dispense Auth. Provider   sulfamethoxazole-trimethoprim (BACTRIM DS) 800-160 MG tablet Take 1 tablet by mouth 2 (two) times daily for 7 days. 14 tablet Kiaan Overholser, Linde Gillis, NP      PDMP not reviewed this encounter.   Nelda Marseille, NP 01/18/23 [phone]

## 2023-01-21 ENCOUNTER — Ambulatory Visit: Payer: BC Managed Care – PPO

## 2023-01-24 ENCOUNTER — Encounter: Payer: BC Managed Care – PPO | Admitting: Family Medicine

## 2023-01-24 ENCOUNTER — Other Ambulatory Visit: Payer: BC Managed Care – PPO

## 2023-01-27 ENCOUNTER — Other Ambulatory Visit: Payer: BC Managed Care – PPO

## 2023-01-27 DIAGNOSIS — E559 Vitamin D deficiency, unspecified: Secondary | ICD-10-CM

## 2023-01-27 DIAGNOSIS — E782 Mixed hyperlipidemia: Secondary | ICD-10-CM

## 2023-01-27 DIAGNOSIS — L4050 Arthropathic psoriasis, unspecified: Secondary | ICD-10-CM

## 2023-01-27 DIAGNOSIS — R7309 Other abnormal glucose: Secondary | ICD-10-CM

## 2023-01-27 DIAGNOSIS — M810 Age-related osteoporosis without current pathological fracture: Secondary | ICD-10-CM

## 2023-01-27 DIAGNOSIS — I10 Essential (primary) hypertension: Secondary | ICD-10-CM

## 2023-01-27 DIAGNOSIS — Z0000 Encounter for general adult medical examination without abnormal findings: Secondary | ICD-10-CM

## 2023-01-28 LAB — CBC WITH DIFFERENTIAL/PLATELET
Absolute Monocytes: 450 {cells}/uL (ref 200–950)
Basophils Absolute: 126 {cells}/uL (ref 0–200)
Basophils Relative: 1.4 %
Eosinophils Absolute: 279 {cells}/uL (ref 15–500)
Eosinophils Relative: 3.1 %
HCT: 35.3 % (ref 35.0–45.0)
Hemoglobin: 11.8 g/dL (ref 11.7–15.5)
Lymphs Abs: 3672 {cells}/uL (ref 850–3900)
MCH: 35.2 pg — ABNORMAL HIGH (ref 27.0–33.0)
MCHC: 33.4 g/dL (ref 32.0–36.0)
MCV: 105.4 fL — ABNORMAL HIGH (ref 80.0–100.0)
MPV: 10.2 fL (ref 7.5–12.5)
Monocytes Relative: 5 %
Neutro Abs: 4473 {cells}/uL (ref 1500–7800)
Neutrophils Relative %: 49.7 %
Platelets: 320 10*3/uL (ref 140–400)
RBC: 3.35 10*6/uL — ABNORMAL LOW (ref 3.80–5.10)
RDW: 11.6 % (ref 11.0–15.0)
Total Lymphocyte: 40.8 %
WBC: 9 10*3/uL (ref 3.8–10.8)

## 2023-01-28 LAB — COMPLETE METABOLIC PANEL WITH GFR
AG Ratio: 1.4 (calc) (ref 1.0–2.5)
ALT: 9 U/L (ref 6–29)
AST: 17 U/L (ref 10–35)
Albumin: 4.1 g/dL (ref 3.6–5.1)
Alkaline phosphatase (APISO): 74 U/L (ref 37–153)
BUN: 12 mg/dL (ref 7–25)
CO2: 22 mmol/L (ref 20–32)
Calcium: 9 mg/dL (ref 8.6–10.4)
Chloride: 91 mmol/L — ABNORMAL LOW (ref 98–110)
Creat: 0.88 mg/dL (ref 0.50–1.05)
Globulin: 2.9 g/dL (ref 1.9–3.7)
Glucose, Bld: 83 mg/dL (ref 65–99)
Potassium: 5.3 mmol/L (ref 3.5–5.3)
Sodium: 122 mmol/L — ABNORMAL LOW (ref 135–146)
Total Bilirubin: 0.4 mg/dL (ref 0.2–1.2)
Total Protein: 7 g/dL (ref 6.1–8.1)
eGFR: 74 mL/min/{1.73_m2} (ref >=60)

## 2023-01-28 LAB — HEMOGLOBIN A1C
Hgb A1c MFr Bld: 5.5 %{Hb} (ref <5.7)
Mean Plasma Glucose: 111 mg/dL
eAG (mmol/L): 6.2 mmol/L

## 2023-01-28 LAB — LIPID PANEL
Cholesterol: 173 mg/dL (ref <200)
HDL: 91 mg/dL (ref >=50)
LDL Cholesterol (Calc): 65 mg/dL
Non-HDL Cholesterol (Calc): 82 mg/dL (ref <130)
Total CHOL/HDL Ratio: 1.9 (calc) (ref <5.0)
Triglycerides: 87 mg/dL (ref <150)

## 2023-01-28 LAB — VITAMIN D 25 HYDROXY (VIT D DEFICIENCY, FRACTURES): Vit D, 25-Hydroxy: 20 ng/mL — ABNORMAL LOW (ref 30–100)

## 2023-01-28 LAB — TSH: TSH: 3.1 m[IU]/L (ref 0.40–4.50)

## 2023-01-31 ENCOUNTER — Encounter: Payer: Self-pay | Admitting: Family Medicine

## 2023-01-31 ENCOUNTER — Ambulatory Visit (INDEPENDENT_AMBULATORY_CARE_PROVIDER_SITE_OTHER): Payer: BC Managed Care – PPO | Admitting: Family Medicine

## 2023-01-31 ENCOUNTER — Other Ambulatory Visit: Payer: Self-pay | Admitting: Family Medicine

## 2023-01-31 VITALS — BP 120/80 | HR 74 | Ht 59.84 in | Wt 124.0 lb

## 2023-01-31 DIAGNOSIS — I10 Essential (primary) hypertension: Secondary | ICD-10-CM

## 2023-01-31 DIAGNOSIS — Z23 Encounter for immunization: Secondary | ICD-10-CM

## 2023-01-31 DIAGNOSIS — Z0000 Encounter for general adult medical examination without abnormal findings: Secondary | ICD-10-CM | POA: Diagnosis not present

## 2023-01-31 DIAGNOSIS — L4050 Arthropathic psoriasis, unspecified: Secondary | ICD-10-CM

## 2023-01-31 DIAGNOSIS — E782 Mixed hyperlipidemia: Secondary | ICD-10-CM

## 2023-01-31 DIAGNOSIS — E871 Hypo-osmolality and hyponatremia: Secondary | ICD-10-CM

## 2023-01-31 DIAGNOSIS — Z1231 Encounter for screening mammogram for malignant neoplasm of breast: Secondary | ICD-10-CM

## 2023-01-31 MED ORDER — ROSUVASTATIN CALCIUM 10 MG PO TABS
10.0000 mg | ORAL_TABLET | ORAL | 3 refills | Status: DC
Start: 2023-01-31 — End: 2024-02-16

## 2023-01-31 NOTE — Progress Notes (Signed)
Subjective:    Patient ID: Jane Sanchez, female    DOB: 06/18/1960, 62 y.o.   MRN: [id number]  Jane Sanchez is a 62 y.o. female presenting on 01/31/2023 for Annual Exam   HPI  Discussed the use of AI scribe software for clinical note transcription with the patient, who gave verbal consent to proceed.   Here for Annual Physical and Lab Review  CHRONIC HTN: Hyponatremia History of chronic low sodium for years, no prior diagnosis. Recent worsening sodium 122, prior range 128-132 Asymptomatic she consumes a significant amount of water daily, approximately 64 ounces or more.  Issues with eyes cataract surgery Eyes watering, matting, itching She was dx with Dry Eye The patient denied any daytime sleepiness or other symptoms of sleep apnea, despite the recent diagnosis of floppy eye syndrome, which can be associated with sleep apnea.  Newly dx Floppy Eye Syndrome, advised to ask about risk of sleep apnea     Home BP readings 110-130 / 70-80 Current Meds - Losartan 50mg  daily, Metoprolol XL 25mg  daily  needs 90 day orders Reports good compliance, took meds today. Tolerating well, w/o complaints. Denies CP, dyspnea, HA, edema, dizziness / lightheadedness   HYPERLIPIDEMIA: - Reports no concerns. Last lipid panel 12/2022, LDL 60s - Currently taking Rosuvastatin 10mg  3 x weekly, tolerating well without side effects or myalgias Fam hx   Allergy induced Asthma Scents and smells perfumes trigger. Improving control asthma On Albuterol PRN   Chronic Back Pain W Sciatica Left sided only Osteoarthritis lumbar spine Admits muscle spasm and cramps. Already taking Gabapentin 300mg  TID History of compression fracture x 3 in back. Back pain sciatica keeping her awake   Back pain improved Finished PT in October Doing home PT exercises and stretches to help   Psoriatic Arthritis / Osteoporosis Switching from Cosyntex to Cimzia now but she developed significant rash on her legs due  to Cimzia and she has come off of the Cimzia and she has apt with Rheumatology next week.  Vitamin D Deficiency Recent lab showed result 20, already ordered by Rheumatology, 50,000 unit weekly for 12 weeks   Waiting on Cimzia and Prolia approval   Psoriasis [facility] - topical clobetasol   Health Maintenance: Flu Shot today  COVID at pharmacy  Future Pneumonia vaccine age 15+     01/31/2023   11:22 AM 07/12/2022   11:01 AM 06/19/2021   10:46 AM  Depression screen PHQ 2/9  Decreased Interest 0 0 0  Down, Depressed, Hopeless 0 0 0  PHQ - 2 Score 0 0 0  Altered sleeping 0 0 0  Tired, decreased energy 1 0 0  Change in appetite 0 0 0  Feeling bad or failure about yourself  0 0 0  Trouble concentrating 0 0 0  Moving slowly or fidgety/restless 0 0 0  Suicidal thoughts 0 0 0  PHQ-9 Score 1 0 0  Difficult doing work/chores  Not difficult at all Not difficult at all    Past Medical History:  Diagnosis Date   Arthritis    Asthma    Hyperlipidemia    Hypertension    Osteoporosis    Past Surgical History:  Procedure Laterality Date   BREAST BIOPSY Bilateral    benign cysts   BREAST EXCISIONAL BIOPSY Left    BREAST EXCISIONAL BIOPSY Right    CESAREAN SECTION  1986   1989, 1991   KNEE SURGERY Left 1983   MENISCECTOMY Right 2011   TOTAL ABDOMINAL HYSTERECTOMY  2001   removal of cervix as well   Social History   Socioeconomic History   Marital status: Divorced    Spouse name: Not on file   Number of children: Not on file   Years of education: Not on file   Highest education level: Not on file  Occupational History   Not on file  Tobacco Use   Smoking status: Never   Smokeless tobacco: Never  Vaping Use   Vaping status: Never Used  Substance and Sexual Activity   Alcohol use: Yes    Alcohol/week: 2.0 standard drinks of alcohol    Types: 2 Glasses of wine per week   Drug use: Never   Sexual activity: Not on file  Other Topics Concern    Not on file  Social History Narrative   Not on file   Social Determinants of Health   Financial Resource Strain: Not on file  Food Insecurity: Not on file  Transportation Needs: Not on file  Physical Activity: Not on file  Stress: Not on file  Social Connections: Unknown (09/08/2021)   [facility] from [facility], [facility]   Social Network    Social Network: Not on file  Intimate Partner Violence: Unknown (07/31/2021)   [facility], [facility]   HITS    Physically Hurt: Not on file    Insult or Talk Down To: Not on file    Threaten Physical Harm: Not on file    Scream or Curse: Not on file   Family History  Problem Relation Age of Onset   Breast cancer Mother    Bladder Cancer Mother    Breast cancer Maternal Aunt    Current Outpatient Medications on File Prior to Visit  Medication Sig   CIMZIA 2 X 200 MG/ML PSKT Inject into the skin.   clobetasol ointment (TEMOVATE) 0.05 % Apply 1 Application topically 2 (two) times daily.   cyclobenzaprine (FLEXERIL) 10 MG tablet TAKE 1/2-1 TABLET BY MOUTH TWICE DAILY AS NEEDED FOR MUSCLE SPASMS   denosumab (PROLIA) 60 MG/ML SOSY injection Inject 60 mg into the skin every 6 (six) months.   EPINEPHrine 0.3 mg/0.3 mL IJ SOAJ injection    gabapentin (NEURONTIN) 300 MG capsule TAKE 1 CAPSULE BY MOUTH 3 TIMES DAILY   metoprolol succinate (TOPROL-XL) 25 MG 24 hr tablet Take 1 tablet (25 mg total) by mouth daily.   valACYclovir (VALTREX) 500 MG tablet TAKE 1 TABLET BY MOUTH ONCE DAILY FOR SUPPRESSION   VEVYE 0.1 % SOLN Apply 1 drop to eye 2 (two) times daily.   Vitamin D, Ergocalciferol, (DRISDOL) 1.25 MG (50000 UNIT) CAPS capsule Take 50,000 Units by mouth once a week.   No current facility-administered medications on file prior to visit.    Review of Systems  Constitutional:  Negative for activity change, appetite change, chills, diaphoresis, fatigue and fever.  HENT:  Negative for congestion and hearing loss.    Eyes:  Negative for visual disturbance.  Respiratory:  Negative for cough, chest tightness, shortness of breath and wheezing.   Cardiovascular:  Negative for chest pain, palpitations and leg swelling.  Gastrointestinal:  Negative for abdominal pain, constipation, diarrhea, nausea and vomiting.  Genitourinary:  Negative for dysuria, frequency and hematuria.  Musculoskeletal:  Negative for arthralgias and neck pain.  Skin:  Negative for rash.  Neurological:  Negative for dizziness, weakness, light-headedness, numbness and headaches.  Hematological:  Negative for adenopathy.  Psychiatric/Behavioral:  Negative for behavioral problems, dysphoric mood and sleep disturbance.  Per HPI unless specifically indicated above     Objective:    BP 120/80   Pulse 74   Ht 4' 11.84" (1.52 m)   Wt 124 lb (56.2 kg)   SpO2 99%   BMI 24.34 kg/m   Wt Readings from Last 3 Encounters:  01/31/23 124 lb (56.2 kg)  01/18/23 117 lb 15.1 oz (53.5 kg)  07/12/22 119 lb 3.2 oz (54.1 kg)    Physical Exam Vitals and nursing note reviewed.  Constitutional:      General: She is not in acute distress.    Appearance: She is well-developed. She is not diaphoretic.     Comments: Well-appearing, comfortable, cooperative  HENT:     Head: Normocephalic and atraumatic.  Eyes:     General:        Right eye: No discharge.        Left eye: No discharge.     Conjunctiva/sclera: Conjunctivae normal.     Pupils: Pupils are equal, round, and reactive to light.  Neck:     Thyroid: No thyromegaly.  Cardiovascular:     Rate and Rhythm: Normal rate and regular rhythm.     Pulses: Normal pulses.     Heart sounds: Normal heart sounds. No murmur heard. Pulmonary:     Effort: Pulmonary effort is normal. No respiratory distress.     Breath sounds: Normal breath sounds. No wheezing or rales.  Abdominal:     General: Bowel sounds are normal. There is no distension.     Palpations: Abdomen is soft. There is no mass.      Tenderness: There is no abdominal tenderness.  Musculoskeletal:        General: No tenderness. Normal range of motion.     Cervical back: Normal range of motion and neck supple.     Comments: Upper / Lower Extremities: - Normal muscle tone, strength bilateral upper extremities 5/5, lower extremities 5/5  Lymphadenopathy:     Cervical: No cervical adenopathy.  Skin:    General: Skin is warm and dry.     Findings: No erythema or rash.  Neurological:     Mental Status: She is alert and oriented to person, place, and time.     Comments: Distal sensation intact to light touch all extremities  Psychiatric:        Mood and Affect: Mood normal.        Behavior: Behavior normal.        Thought Content: Thought content normal.     Comments: Well groomed, good eye contact, normal speech and thoughts     I have personally reviewed the radiology report from 11/28/21 on Head CT.  CLINICAL DATA:  Altered mental status.   EXAM: CT HEAD WITHOUT CONTRAST   TECHNIQUE: Contiguous axial images were obtained from the base of the skull through the vertex without intravenous contrast.   RADIATION DOSE REDUCTION: This exam was performed according to the departmental dose-optimization program which includes automated exposure control, adjustment of the mA and/or kV according to patient size and/or use of iterative reconstruction technique.   COMPARISON:  None Available.   FINDINGS: Brain: The ventricles and sulci are appropriate size for the patient's age. The gray-white matter discrimination is preserved. There is no acute intracranial hemorrhage. No mass effect or midline shift. No extra-axial fluid collection.   Vascular: No hyperdense vessel or unexpected calcification.   Skull: Normal. Negative for fracture or focal lesion.   Sinuses/Orbits: Mild mucoperiosteal thickening of paranasal sinuses with  opacification of several ethmoid air cells. No air-fluid. The mid of the visualized  paranasal sinuses and mastoid air cells are clear.   Other: None   IMPRESSION: No acute intracranial pathology.     Electronically Signed   By: Elgie Collard M.D.   On: 11/28/2021 01:57  Results for orders placed or performed in visit on 01/27/23  VITAMIN D 25 Hydroxy (Vit-D Deficiency, Fractures)  Result Value Ref Range   Vit D, 25-Hydroxy 20 (L) 30 - 100 ng/mL  TSH  Result Value Ref Range   TSH 3.10 0.40 - 4.50 mIU/L  Lipid panel  Result Value Ref Range   Cholesterol 173 <200 mg/dL   HDL 91 > OR = 50 mg/dL   Triglycerides 87 <295 mg/dL   LDL Cholesterol (Calc) 65 mg/dL (calc)   Total CHOL/HDL Ratio 1.9 <5.0 (calc)   Non-HDL Cholesterol (Calc) 82 <621 mg/dL (calc)  Hemoglobin H0Q  Result Value Ref Range   Hgb A1c MFr Bld 5.5 <5.7 % of total Hgb   Mean Plasma Glucose 111 mg/dL   eAG (mmol/L) 6.2 mmol/L  CBC with Differential/Platelet  Result Value Ref Range   WBC 9.0 3.8 - 10.8 Thousand/uL   RBC 3.35 (L) 3.80 - 5.10 Million/uL   Hemoglobin 11.8 11.7 - 15.5 g/dL   HCT 65.7 84.6 - 96.2 %   MCV 105.4 (H) 80.0 - 100.0 fL   MCH 35.2 (H) 27.0 - 33.0 pg   MCHC 33.4 32.0 - 36.0 g/dL   RDW 95.2 84.1 - 32.4 %   Platelets 320 140 - 400 Thousand/uL   MPV 10.2 7.5 - 12.5 fL   Neutro Abs 4,473 1,500 - 7,800 cells/uL   Lymphs Abs 3,672 850 - 3,900 cells/uL   Absolute Monocytes 450 200 - 950 cells/uL   Eosinophils Absolute 279 15 - 500 cells/uL   Basophils Absolute 126 0 - 200 cells/uL   Neutrophils Relative % 49.7 %   Total Lymphocyte 40.8 %   Monocytes Relative 5.0 %   Eosinophils Relative 3.1 %   Basophils Relative 1.4 %  COMPLETE METABOLIC PANEL WITH GFR  Result Value Ref Range   Glucose, Bld 83 65 - 99 mg/dL   BUN 12 7 - 25 mg/dL   Creat 4.01 0.27 - 2.53 mg/dL   eGFR 74 > OR = 60 GU/YQI/3.47Q2   BUN/Creatinine Ratio SEE NOTE: 6 - 22 (calc)   Sodium 122 (L) 135 - 146 mmol/L   Potassium 5.3 3.5 - 5.3 mmol/L   Chloride 91 (L) 98 - 110 mmol/L   CO2 22 20 - 32  mmol/L   Calcium 9.0 8.6 - 10.4 mg/dL   Total Protein 7.0 6.1 - 8.1 g/dL   Albumin 4.1 3.6 - 5.1 g/dL   Globulin 2.9 1.9 - 3.7 g/dL (calc)   AG Ratio 1.4 1.0 - 2.5 (calc)   Total Bilirubin 0.4 0.2 - 1.2 mg/dL   Alkaline phosphatase (APISO) 74 37 - 153 U/L   AST 17 10 - 35 U/L   ALT 9 6 - 29 U/L      Assessment & Plan:   Problem List Items Addressed This Visit     Essential hypertension   Relevant Medications   rosuvastatin (CRESTOR) 10 MG tablet   Mixed hyperlipidemia   Relevant Medications   rosuvastatin (CRESTOR) 10 MG tablet   Psoriatic arthritis (HCC)   Other Visit Diagnoses     Annual physical exam    -  Primary   Need for influenza  vaccination       Relevant Orders   Flu vaccine trivalent PF, 6mos and older(Flulaval,Afluria,Fluarix,Fluzone) (Completed)   Encounter for screening mammogram for malignant neoplasm of breast       Relevant Orders   MM 3D SCREENING MAMMOGRAM BILATERAL BREAST       Updated Health Maintenance information Reviewed recent lab results with patient Encouraged improvement to lifestyle with diet and exercise Goal of weight loss  Assessment and Plan    Vitamin D Deficiency Fatigue and bone pain. Started on Vitamin D 50,000 units weekly for 12 weeks. -Continue Vitamin D 50,000 units weekly for 12 weeks.  Hyponatremia Chronic low sodium, currently at 122. Patient drinks approximately 64 ounces of water daily. Possible SIADH No other medication side effect or other likely etiology -Reduce daily water intake. -Check sodium levels in 4 weeks on 03/03/2023.Marland Kitchen also add Urine Sodium + Urine Osm to upcoming lab visit.  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 No daytime sleepiness reported. -Continue current eye treatment. -Consider sleep study in the future if symptoms suggest sleep apnea.  General Health Maintenance -Mammogram to be scheduled 01/2023 -Flu  shot received. -Colonoscopy due in February 2025. -Pneumonia vaccine due at age 89. -Plan to receive COVID-19 booster shot.   Orders Placed This Encounter  Procedures   MM 3D SCREENING MAMMOGRAM BILATERAL BREAST    Standing Status:   Future    Standing Expiration Date:   01/31/2024    Order Specific Question:   Reason for Exam (SYMPTOM  OR DIAGNOSIS REQUIRED)    Answer:   Screening bilateral 3D Mammogram Tomo    Order Specific Question:   Preferred imaging location?    Answer:   [facility]   Flu vaccine trivalent PF, 6mos and older(Flulaval,Afluria,Fluarix,Fluzone)     Meds ordered this encounter  Medications   rosuvastatin (CRESTOR) 10 MG tablet    Sig: Take 1 tablet (10 mg total) by mouth 3 (three) times a week.    Dispense:  36 tablet    Refill:  3      Follow up plan: Return in about 1 month (around 03/03/2023) for lab 11/4.  Future labs ordered 02/2023 ' Saralyn Pilar, DO [facility] 01/31/2023, 11:35 AM

## 2023-01-31 NOTE — Patient Instructions (Addendum)
Thank you for coming to the office today.   For Mammogram screening for breast cancer   Call the Imaging Center below anytime to schedule your own appointment now that order has been placed.  [facility] [address] [place], [place] [address] Phone: [phone]   Please schedule a Follow-up Appointment to: Return in about 1 month (around 03/03/2023) for lab 11/4.  If you have any other questions or concerns, please feel free to call the office or send a message through MyChart. You may also schedule an earlier appointment if necessary.  Additionally, you may be receiving a survey about your experience at our office within a few days to 1 week by e-mail or mail. We value your feedback.  Saralyn Pilar, DO [facility], [place]

## 2023-03-03 ENCOUNTER — Other Ambulatory Visit: Payer: BC Managed Care – PPO

## 2023-03-03 DIAGNOSIS — E871 Hypo-osmolality and hyponatremia: Secondary | ICD-10-CM

## 2023-03-05 LAB — BASIC METABOLIC PANEL WITH GFR
BUN: 9 mg/dL (ref 7–25)
CO2: 23 mmol/L (ref 20–32)
Calcium: 9.3 mg/dL (ref 8.6–10.4)
Chloride: 98 mmol/L (ref 98–110)
Creat: 0.72 mg/dL (ref 0.50–1.05)
Glucose, Bld: 89 mg/dL (ref 65–99)
Potassium: 4.6 mmol/L (ref 3.5–5.3)
Sodium: 131 mmol/L — ABNORMAL LOW (ref 135–146)
eGFR: 94 mL/min/{1.73_m2} (ref >=60)

## 2023-03-05 LAB — SODIUM, URINE, RANDOM: Sodium, Ur: 64 mmol/L (ref 28–272)

## 2023-03-05 LAB — OSMOLALITY, URINE: Osmolality, Ur: 246 mosm/kg (ref 50–1200)

## 2023-03-29 ENCOUNTER — Other Ambulatory Visit: Payer: Self-pay | Admitting: Family Medicine

## 2023-03-29 DIAGNOSIS — B001 Herpesviral vesicular dermatitis: Secondary | ICD-10-CM

## 2023-03-29 DIAGNOSIS — I10 Essential (primary) hypertension: Secondary | ICD-10-CM

## 2023-04-01 ENCOUNTER — Other Ambulatory Visit: Payer: Self-pay | Admitting: Internal Medicine

## 2023-04-01 ENCOUNTER — Other Ambulatory Visit: Payer: Self-pay | Admitting: Family Medicine

## 2023-04-01 ENCOUNTER — Ambulatory Visit: Payer: Self-pay | Admitting: *Deleted

## 2023-04-01 ENCOUNTER — Telehealth: Payer: Self-pay | Admitting: Family Medicine

## 2023-04-01 DIAGNOSIS — G8929 Other chronic pain: Secondary | ICD-10-CM

## 2023-04-01 DIAGNOSIS — G629 Polyneuropathy, unspecified: Secondary | ICD-10-CM

## 2023-04-01 DIAGNOSIS — I10 Essential (primary) hypertension: Secondary | ICD-10-CM

## 2023-04-01 NOTE — Telephone Encounter (Signed)
Requested Prescriptions  Pending Prescriptions Disp Refills   olmesartan (BENICAR) 40 MG tablet [Pharmacy Med Name: OLMESARTAN MEDOXOMIL 40 MG TAB] 90 tablet 3    Sig: TAKE 1 TABLET BY MOUTH ONCE DAILY **STOPTAKING LOSARTAN**     Cardiovascular:  Angiotensin Receptor Blockers Passed - 03/29/2023  9:23 AM      Passed - Cr in normal range and within 180 days    Creat  Date Value Ref Range Status  03/03/2023 0.72 0.50 - 1.05 mg/dL Final         Passed - K in normal range and within 180 days    Potassium  Date Value Ref Range Status  03/03/2023 4.6 3.5 - 5.3 mmol/L Final         Passed - Patient is not pregnant      Passed - Last BP in normal range    BP Readings from Last 1 Encounters:  01/31/23 120/80         Passed - Valid encounter within last 6 months    Recent Outpatient Visits           2 months ago Annual physical exam   [place] [facility] Smitty Cords, DO   8 months ago Essential hypertension   [place] [facility] Smitty Cords, DO   1 year ago Essential hypertension   [place] [facility] Smitty Cords, DO   1 year ago AKI (acute kidney injury) [facility])   [place] [facility] Smitty Cords, DO   1 year ago Essential hypertension   [place] [facility] [place], Netta Neat, DO               valACYclovir (VALTREX) 500 MG tablet [Pharmacy Med Name: VALACYCLOVIR HCL 500 MG TAB] 90 tablet 2    Sig: TAKE 1 TABLET BY MOUTH ONCE DAILY FOR SUPPRESSION     Antimicrobials:  Antiviral Agents - Anti-Herpetic Passed - 03/29/2023  9:23 AM      Passed - Valid encounter within last 12 months    Recent Outpatient Visits           2 months ago Annual physical exam   [place] [facility] [place], Netta Neat, DO   8 months ago Essential hypertension   [place] [facility] Smitty Cords, DO   1 year ago Essential hypertension   [place] [facility] Smitty Cords, DO   1 year ago AKI (acute kidney injury) [facility])   [place] [facility] Smitty Cords, DO   1 year ago Essential hypertension   [place] [facility] [place], Netta Neat, [place]

## 2023-04-01 NOTE — Telephone Encounter (Addendum)
Pt has been trying to refill her olmasartan.  But the pharmacy told her it was d/c in Sept 2023.  Pt states that is the first time she has heard about this.   There has been several messages sent concerning this.

## 2023-04-01 NOTE — Telephone Encounter (Signed)
Opened chart to answer question for agent regarding olmasartan and losartan.   Dr. Althea Charon will need to clarify which medication she is to be on.

## 2023-04-02 MED ORDER — OLMESARTAN MEDOXOMIL 40 MG PO TABS: 40.0000 mg | ORAL_TABLET | Freq: Every day | ORAL | 0 refills | Status: DC

## 2023-04-03 NOTE — Telephone Encounter (Signed)
Requested Prescriptions  Pending Prescriptions Disp Refills   metoprolol succinate (TOPROL-XL) 25 MG 24 hr tablet [Pharmacy Med Name: METOPROLOL SUCCINATE ER 25 MG TAB] 90 tablet 3    Sig: TAKE 1 TABLET BY MOUTH ONCE DAILY     Cardiovascular:  Beta Blockers Passed - 04/01/2023  7:23 PM      Passed - Last BP in normal range    BP Readings from Last 1 Encounters:  01/31/23 120/80         Passed - Last Heart Rate in normal range    Pulse Readings from Last 1 Encounters:  01/31/23 74         Passed - Valid encounter within last 6 months    Recent Outpatient Visits           2 months ago Annual physical exam   [place] [facility] [place], Netta Neat, DO   8 months ago Essential hypertension   [place] [facility] Smitty Cords, DO   1 year ago Essential hypertension   [place] [facility] Smitty Cords, DO   1 year ago AKI (acute kidney injury) [facility])   [place] [facility] Smitty Cords, DO   1 year ago Essential hypertension   [place] [facility] [place], Netta Neat, DO               gabapentin (NEURONTIN) 300 MG capsule [Pharmacy Med Name: GABAPENTIN 300 MG CAP] 270 capsule 2    Sig: TAKE 1 CAPSULE BY MOUTH 3 TIMES DAILY     Neurology: Anticonvulsants - gabapentin Passed - 04/01/2023  7:23 PM      Passed - Cr in normal range and within 360 days    Creat  Date Value Ref Range Status  03/03/2023 0.72 0.50 - 1.05 mg/dL Final         Passed - Completed PHQ-2 or PHQ-9 in the last 360 days      Passed - Valid encounter within last 12 months    Recent Outpatient Visits           2 months ago Annual physical exam   [place] [facility] [place], Netta Neat, DO   8 months ago Essential hypertension   [place] [facility] Smitty Cords, DO   1 year ago Essential hypertension   [facility] Smit[facility] Cords, DO   1 year ago AKI (acute kidney injury) [facility])   [place] [facility] Smitty Cords, DO   1 year ago Essential hypertension   [place] [facility] [place], Netta Neat, [place]

## 2023-04-03 NOTE — Telephone Encounter (Signed)
Requested medication (s) are due for refill today: yes   Requested medication (s) are on the active medication list: yes   Last refill:  01/09/23 #90 0 refills  Future visit scheduled: no  Notes to clinic:  not delegated per protocol. Do you want to refill Rx?     Requested Prescriptions  Pending Prescriptions Disp Refills   cyclobenzaprine (FLEXERIL) 10 MG tablet [Pharmacy Med Name: CYCLOBENZAPRINE HCL 10 MG TAB] 90 tablet 0    Sig: TAKE 1/2-1 TABLET BY MOUTH TWICE DAILY AS NEEDED FOR MUSCLE SPASMS     Not Delegated - Analgesics:  Muscle Relaxants Failed - 04/01/2023  7:22 PM      Failed - This refill cannot be delegated      Passed - Valid encounter within last 6 months    Recent Outpatient Visits           2 months ago Annual physical exam   [place] [facility] Smitty Cords, DO   8 months ago Essential hypertension   [place] [facility] Smitty Cords, DO   1 year ago Essential hypertension   [place] [facility] Smit[facility] Cords, DO   1 year ago AKI (acute kidney injury) [facility])   [place] [facility] Smitty Cords, DO   1 year ago Essential hypertension   [place] [facility] [place], Netta Neat, [place]

## 2023-04-09 ENCOUNTER — Ambulatory Visit
Admission: RE | Admit: 2023-04-09 | Discharge: 2023-04-09 | Disposition: A | Discharge status: Home / Self Care | Payer: BC Managed Care – PPO | Source: Ambulatory Visit | Attending: Family Medicine | Admitting: Family Medicine

## 2023-04-09 DIAGNOSIS — Z1231 Encounter for screening mammogram for malignant neoplasm of breast: Secondary | ICD-10-CM | POA: Diagnosis present

## 2023-06-30 ENCOUNTER — Other Ambulatory Visit: Payer: Self-pay | Admitting: Internal Medicine

## 2023-07-01 NOTE — Telephone Encounter (Signed)
 Requested Prescriptions  Pending Prescriptions Disp Refills   olmesartan (BENICAR) 40 MG tablet [Pharmacy Med Name: OLMESARTAN MEDOXOMIL 40 MG TAB] 90 tablet 0    Sig: TAKE 1 TABLET BY MOUTH ONCE DAILY     Cardiovascular:  Angiotensin Receptor Blockers Passed - 07/01/2023  2:05 PM      Passed - Cr in normal range and within 180 days    Creat  Date Value Ref Range Status  03/03/2023 0.72 0.50 - 1.05 mg/dL Final         Passed - K in normal range and within 180 days    Potassium  Date Value Ref Range Status  03/03/2023 4.6 3.5 - 5.3 mmol/L Final         Passed - Patient is not pregnant      Passed - Last BP in normal range    BP Readings from Last 1 Encounters:  01/31/23 120/80         Passed - Valid encounter within last 6 months    Recent Outpatient Visits           5 months ago Annual physical exam   [place] [facility] Smit[facility] Cords, DO   11 months ago Essential hypertension   [place] [facility] Smit[facility] Cords, DO   1 year ago Essential hypertension   [facility] Smitty Cords, DO   1 year ago AKI (acute kidney injury) [facility])   [place] [facility] Smitty Cords, DO   2 years ago Essential hypertension    [facility] [place], Netta Neat, [place]

## 2023-07-11 ENCOUNTER — Other Ambulatory Visit: Payer: Self-pay | Admitting: Family Medicine

## 2023-07-11 DIAGNOSIS — I10 Essential (primary) hypertension: Secondary | ICD-10-CM

## 2023-07-11 NOTE — Telephone Encounter (Signed)
 Patient will need an office visit for additional refills. Requested Prescriptions  Pending Prescriptions Disp Refills   metoprolol succinate (TOPROL-XL) 25 MG 24 hr tablet [Pharmacy Med Name: METOPROLOL SUCCINATE ER 25 MG TAB] 90 tablet 0    Sig: TAKE 1 TABLET BY MOUTH ONCE DAILY     Cardiovascular:  Beta Blockers Passed - 07/11/2023  4:57 PM      Passed - Last BP in normal range    BP Readings from Last 1 Encounters:  01/31/23 120/80         Passed - Last Heart Rate in normal range    Pulse Readings from Last 1 Encounters:  01/31/23 74         Passed - Valid encounter within last 6 months    Recent Outpatient Visits           5 months ago Annual physical exam   [place] [facility] Smitty Cords, DO   12 months ago Essential hypertension   [place] [facility] Smitty Cords, DO   1 year ago Essential hypertension   [facility] Smitty Cords, DO   1 year ago AKI (acute kidney injury) [facility])   [facility] Smitty Cords, DO   2 years ago Essential hypertension   [place] [facility] [place], [place], [place]

## 2023-09-29 ENCOUNTER — Other Ambulatory Visit: Payer: Self-pay | Admitting: Internal Medicine

## 2023-09-30 NOTE — Telephone Encounter (Signed)
 Courtesy refill. Patient will need an office visit for additional refills.  Requested Prescriptions  Pending Prescriptions Disp Refills   olmesartan  (BENICAR ) 40 MG tablet [Pharmacy Med Name: OLMESARTAN  MEDOXOMIL 40 MG TAB] 30 tablet 0    Sig: TAKE 1 TABLET BY MOUTH ONCE DAILY     Cardiovascular:  Angiotensin Receptor Blockers Failed - 09/30/2023 12:24 PM      Failed - Cr in normal range and within 180 days    Creat  Date Value Ref Range Status  03/03/2023 0.72 0.50 - 1.05 mg/dL Final         Failed - K in normal range and within 180 days    Potassium  Date Value Ref Range Status  03/03/2023 4.6 3.5 - 5.3 mmol/L Final         Failed - Valid encounter within last 6 months    Recent Outpatient Visits   None            Passed - Patient is not pregnant      Passed - Last BP in normal range    BP Readings from Last 1 Encounters:  01/31/23 120/80

## 2023-10-01 ENCOUNTER — Other Ambulatory Visit: Payer: Self-pay | Admitting: Family Medicine

## 2023-10-01 ENCOUNTER — Other Ambulatory Visit: Payer: Self-pay | Admitting: Internal Medicine

## 2023-10-01 DIAGNOSIS — I10 Essential (primary) hypertension: Secondary | ICD-10-CM

## 2023-10-01 DIAGNOSIS — G8929 Other chronic pain: Secondary | ICD-10-CM

## 2023-10-02 ENCOUNTER — Other Ambulatory Visit: Payer: Self-pay | Admitting: Internal Medicine

## 2023-10-02 DIAGNOSIS — G8929 Other chronic pain: Secondary | ICD-10-CM

## 2023-10-02 NOTE — Telephone Encounter (Signed)
 Requested Prescriptions  Pending Prescriptions Disp Refills   metoprolol  succinate (TOPROL -XL) 25 MG 24 hr tablet [Pharmacy Med Name: METOPROLOL  SUCCINATE ER 25 MG TAB] 90 tablet 0    Sig: TAKE 1 TABLET BY MOUTH ONCE DAILY     Cardiovascular:  Beta Blockers Failed - 10/02/2023  5:44 PM      Failed - Valid encounter within last 6 months    Recent Outpatient Visits   None            Passed - Last BP in normal range    BP Readings from Last 1 Encounters:  01/31/23 120/80         Passed - Last Heart Rate in normal range    Pulse Readings from Last 1 Encounters:  01/31/23 74

## 2023-10-02 NOTE — Telephone Encounter (Signed)
 Requested medications are due for refill today.  yes  Requested medications are on the active medications list.  yes  Last refill. 04/04/2023 #90 0 rf  Future visit scheduled.   yes  Notes to clinic.  Refill not delegated.    Requested Prescriptions  Pending Prescriptions Disp Refills   cyclobenzaprine  (FLEXERIL ) 10 MG tablet [Pharmacy Med Name: CYCLOBENZAPRINE  HCL 10 MG TAB] 90 tablet 0    Sig: TAKE 1/2-1 TABLET BY MOUTH TWICE DAILY AS NEEDED FOR MUSCLE SPASMS     Not Delegated - Analgesics:  Muscle Relaxants Failed - 10/02/2023  1:23 PM      Failed - This refill cannot be delegated      Failed - Valid encounter within last 6 months    Recent Outpatient Visits   None

## 2023-10-03 NOTE — Telephone Encounter (Signed)
 Requested medications are due for refill today.  yes  Requested medications are on the active medications list.  yes  Last refill. 04/04/2023 #90 0 rf  Future visit scheduled.   yes  Notes to clinic.  Refill not delegated.    Requested Prescriptions  Pending Prescriptions Disp Refills   cyclobenzaprine  (FLEXERIL ) 10 MG tablet [Pharmacy Med Name: CYCLOBENZAPRINE  HCL 10 MG TAB] 90 tablet 0    Sig: TAKE 1/2-1 TABLET BY MOUTH TWICE DAILY AS NEEDED FOR MUSCLE SPASMS     Not Delegated - Analgesics:  Muscle Relaxants Failed - 10/03/2023  2:04 PM      Failed - This refill cannot be delegated      Failed - Valid encounter within last 6 months    Recent Outpatient Visits   None

## 2023-11-03 ENCOUNTER — Encounter: Payer: Self-pay | Admitting: Family Medicine

## 2023-11-03 ENCOUNTER — Ambulatory Visit: Payer: Self-pay | Admitting: Family Medicine

## 2023-11-03 ENCOUNTER — Other Ambulatory Visit: Payer: Self-pay | Admitting: Family Medicine

## 2023-11-03 VITALS — BP 122/74 | HR 77 | Ht 59.84 in | Wt 126.8 lb

## 2023-11-03 DIAGNOSIS — G8929 Other chronic pain: Secondary | ICD-10-CM | POA: Diagnosis not present

## 2023-11-03 DIAGNOSIS — B001 Herpesviral vesicular dermatitis: Secondary | ICD-10-CM | POA: Diagnosis not present

## 2023-11-03 DIAGNOSIS — I10 Essential (primary) hypertension: Secondary | ICD-10-CM

## 2023-11-03 DIAGNOSIS — E782 Mixed hyperlipidemia: Secondary | ICD-10-CM

## 2023-11-03 DIAGNOSIS — Z0000 Encounter for general adult medical examination without abnormal findings: Secondary | ICD-10-CM

## 2023-11-03 DIAGNOSIS — R7309 Other abnormal glucose: Secondary | ICD-10-CM

## 2023-11-03 DIAGNOSIS — L4050 Arthropathic psoriasis, unspecified: Secondary | ICD-10-CM

## 2023-11-03 DIAGNOSIS — M5442 Lumbago with sciatica, left side: Secondary | ICD-10-CM

## 2023-11-03 DIAGNOSIS — E559 Vitamin D deficiency, unspecified: Secondary | ICD-10-CM

## 2023-11-03 DIAGNOSIS — E871 Hypo-osmolality and hyponatremia: Secondary | ICD-10-CM

## 2023-11-03 MED ORDER — CYCLOBENZAPRINE HCL 10 MG PO TABS
10.0000 mg | ORAL_TABLET | Freq: Every day | ORAL | 3 refills | Status: AC
Start: 2023-11-03 — End: ?

## 2023-11-03 MED ORDER — METOPROLOL SUCCINATE ER 25 MG PO TB24
25.0000 mg | ORAL_TABLET | Freq: Every day | ORAL | 3 refills | Status: AC
Start: 2023-11-03 — End: ?

## 2023-11-03 MED ORDER — OLMESARTAN MEDOXOMIL 40 MG PO TABS
40.0000 mg | ORAL_TABLET | Freq: Every day | ORAL | 3 refills | Status: AC
Start: 2023-11-03 — End: ?

## 2023-11-03 MED ORDER — VALACYCLOVIR HCL 500 MG PO TABS: 500.0000 mg | ORAL_TABLET | Freq: Every day | ORAL | 3 refills | Status: AC

## 2023-11-03 NOTE — Progress Notes (Signed)
 Subjective:    Patient ID: Jane Sanchez, female    DOB: 1960/05/08, 63 y.o.   MRN: [id number]  Jane Sanchez is a 63 y.o. female presenting on 11/03/2023 for Hypertension   HPI  Discussed the use of AI scribe software for clinical note transcription with the patient, who gave verbal consent to proceed.  History of Present Illness   The patient is a 63 year old who presents for medication refills.  Medication management - Difficulty with medication refills, specifically one medication refilled for only one month instead of the usual three months, causing inconvenience due to upcoming travel plans - Flexeril  taken nightly, sometimes only half a pill if not restless, with approximately two months of supply remaining - Metoprolol  provided as a 90-day supply, but additional medication needed to last until next appointment - Cholesterol medication taken three days per week, with sufficient supply until the fall - Completed a course of vitamin D  (once weekly for six weeks), now using an over-the-counter supplement - Gabapentin  taken as needed, with adequate supply available - Valacyclovir  ordered in December with a 90-day supply and two refills remaining  Preventive health maintenance - Physical examination scheduled for November - Laboratory appointment scheduled for October 3rd - Plans to receive influenza vaccination and possibly pneumococcal vaccination during October visit     CHRONIC HTN: Hyponatremia History of chronic low sodium for years Current Meds - Olmesartan  40mg  daily, Metoprolol  XL 25mg  daily  needs 90 day orders Reports good compliance, took meds today. Tolerating well, w/o complaints. Denies CP, dyspnea, HA, edema, dizziness / lightheadedness      Health Maintenance:   Pneumonia vaccine prevnar 20 recommended     11/03/2023   10:36 AM 01/31/2023   11:22 AM 07/12/2022   11:01 AM  Depression screen PHQ 2/9  Decreased Interest 0 0 0  Down, Depressed, Hopeless  0 0 0  PHQ - 2 Score 0 0 0  Altered sleeping 0 0 0  Tired, decreased energy 0 1 0  Change in appetite 0 0 0  Feeling bad or failure about yourself  0 0 0  Trouble concentrating 0 0 0  Moving slowly or fidgety/restless 0 0 0  Suicidal thoughts 0 0 0  PHQ-9 Score 0 1 0  Difficult doing work/chores Not difficult at all  Not difficult at all       11/03/2023   10:36 AM 01/31/2023   11:22 AM 07/12/2022   11:01 AM 06/19/2021   10:46 AM  GAD 7 : Generalized Anxiety Score  Nervous, Anxious, on Edge 0 0 0 0  Control/stop worrying 0 0 0 0  Worry too much - different things 0 0 0 0  Trouble relaxing 0 0 0 0  Restless 0 0 0 0  Easily annoyed or irritable 0 0 0 0  Afraid - awful might happen 0 0 0 0  Total GAD 7 Score 0 0 0 0  Anxiety Difficulty Not difficult at all  Not difficult at all Not difficult at all    Social History   Tobacco Use   Smoking status: Never   Smokeless tobacco: Never  Vaping Use   Vaping status: Never Used  Substance Use Topics   Alcohol use: Yes    Alcohol/week: 2.0 standard drinks of alcohol    Types: 2 Glasses of wine per week   Drug use: Never    Review of Systems Per HPI unless specifically indicated above     Objective:    BP  122/74 (BP Location: Left Arm, Patient Position: Sitting, Cuff Size: Normal)   Pulse 77   Ht 4' 11.84 (1.52 m)   Wt 126 lb 12.8 oz (57.5 kg)   SpO2 98%   BMI 24.90 kg/m   Wt Readings from Last 3 Encounters:  11/03/23 126 lb 12.8 oz (57.5 kg)  01/31/23 124 lb (56.2 kg)  01/18/23 117 lb 15.1 oz (53.5 kg)    Physical Exam Vitals and nursing note reviewed.  Constitutional:      General: She is not in acute distress.    Appearance: Normal appearance. She is well-developed. She is not diaphoretic.     Comments: Well-appearing, comfortable, cooperative  HENT:     Head: Normocephalic and atraumatic.  Eyes:     General:        Right eye: No discharge.        Left eye: No discharge.     Conjunctiva/sclera: Conjunctivae  normal.  Cardiovascular:     Rate and Rhythm: Normal rate.  Pulmonary:     Effort: Pulmonary effort is normal.  Skin:    General: Skin is warm and dry.     Findings: No erythema or rash.  Neurological:     Mental Status: She is alert and oriented to person, place, and time.  Psychiatric:        Mood and Affect: Mood normal.        Behavior: Behavior normal.        Thought Content: Thought content normal.     Comments: Well groomed, good eye contact, normal speech and thoughts     Results for orders placed or performed in visit on 03/03/23  Osmolality, urine   Collection Time: 03/03/23  9:37 AM  Result Value Ref Range   Osmolality, Ur 246 50 - 1,200 mOsm/kg  Sodium, urine, random   Collection Time: 03/03/23  9:37 AM  Result Value Ref Range   Sodium, Ur 64 28 - 272 mmol/L  BASIC METABOLIC PANEL WITH GFR   Collection Time: 03/03/23  9:37 AM  Result Value Ref Range   Glucose, Bld 89 65 - 99 mg/dL   BUN 9 7 - 25 mg/dL   Creat 9.27 9.49 - 8.94 mg/dL   eGFR 94 > OR = 60 fO/fpw/8.26f7   BUN/Creatinine Ratio SEE NOTE: 6 - 22 (calc)   Sodium 131 (L) 135 - 146 mmol/L   Potassium 4.6 3.5 - 5.3 mmol/L   Chloride 98 98 - 110 mmol/L   CO2 23 20 - 32 mmol/L   Calcium  9.3 8.6 - 10.4 mg/dL      Assessment & Plan:   Problem List Items Addressed This Visit     Chronic left-sided low back pain with left-sided sciatica   Relevant Medications   cyclobenzaprine  (FLEXERIL ) 10 MG tablet   Cold sore   Relevant Medications   valACYclovir  (VALTREX ) 500 MG tablet   Essential hypertension - Primary   Relevant Medications   olmesartan  (BENICAR ) 40 MG tablet   metoprolol  succinate (TOPROL -XL) 25 MG 24 hr tablet     Medication Refill Management Refills needed for Flexeril , metoprolol , and Valacyclovir . Visit required due to refill protocol issues. - Refilled Flexeril  with nightly dosage instructions. - Refilled metoprolol  to ensure supply until next appointment. - Refilled Valacyclovir   for daily suppression.  General Health Maintenance Due for pneumonia vaccination. Discussed simultaneous administration with flu shot. Willing to receive pneumonia vaccine today if covered by insurance. - Schedule pneumonia vaccination based on insurance coverage and preference. -  Schedule flu shot for mid-September. - Coordinate both vaccinations during the same visit if preferred.  Follow-up Physical scheduled for early November. Lab work and vaccinations planned for October visit. - Scheduled lab work for October 3rd at 9:15 AM. - Scheduled nurse visit for flu and pneumonia vaccinations on October 3rd following lab work.        No orders of the defined types were placed in this encounter.   Meds ordered this encounter  Medications   olmesartan  (BENICAR ) 40 MG tablet    Sig: Take 1 tablet (40 mg total) by mouth daily.    Dispense:  90 tablet    Refill:  3    Out of med, needs filled ASAP and then future refills added.   cyclobenzaprine  (FLEXERIL ) 10 MG tablet    Sig: Take 1 tablet (10 mg total) by mouth at bedtime.    Dispense:  90 tablet    Refill:  3    Add future refills   metoprolol  succinate (TOPROL -XL) 25 MG 24 hr tablet    Sig: Take 1 tablet (25 mg total) by mouth daily.    Dispense:  90 tablet    Refill:  3    Add refills   valACYclovir  (VALTREX ) 500 MG tablet    Sig: Take 1 tablet (500 mg total) by mouth daily.    Dispense:  90 tablet    Refill:  3    Add refills up to 1 year    Follow up plan: Return if symptoms worsen or fail to improve.  Future labs ordered for 01/30/24  Marsa Officer, DO [facility] 11/03/2023, 10:49 AM

## 2023-11-03 NOTE — Patient Instructions (Addendum)

## 2024-01-30 ENCOUNTER — Other Ambulatory Visit: Payer: Self-pay

## 2024-01-30 ENCOUNTER — Ambulatory Visit (INDEPENDENT_AMBULATORY_CARE_PROVIDER_SITE_OTHER)

## 2024-01-30 DIAGNOSIS — Z23 Encounter for immunization: Secondary | ICD-10-CM | POA: Diagnosis not present

## 2024-01-30 DIAGNOSIS — R7309 Other abnormal glucose: Secondary | ICD-10-CM

## 2024-01-30 DIAGNOSIS — I10 Essential (primary) hypertension: Secondary | ICD-10-CM

## 2024-01-30 DIAGNOSIS — L4050 Arthropathic psoriasis, unspecified: Secondary | ICD-10-CM

## 2024-01-30 DIAGNOSIS — Z0000 Encounter for general adult medical examination without abnormal findings: Secondary | ICD-10-CM

## 2024-01-30 DIAGNOSIS — E871 Hypo-osmolality and hyponatremia: Secondary | ICD-10-CM

## 2024-01-30 DIAGNOSIS — E559 Vitamin D deficiency, unspecified: Secondary | ICD-10-CM

## 2024-01-30 DIAGNOSIS — E782 Mixed hyperlipidemia: Secondary | ICD-10-CM

## 2024-01-31 LAB — LIPID PANEL
Cholesterol: 198 mg/dL (ref <200)
HDL: 107 mg/dL (ref >=50)
LDL Cholesterol (Calc): 79 mg/dL
Non-HDL Cholesterol (Calc): 91 mg/dL (ref <130)
Total CHOL/HDL Ratio: 1.9 (calc) (ref <5.0)
Triglycerides: 50 mg/dL (ref <150)

## 2024-01-31 LAB — CBC WITH DIFFERENTIAL/PLATELET
Absolute Lymphocytes: 2098 {cells}/uL (ref 850–3900)
Absolute Monocytes: 469 {cells}/uL (ref 200–950)
Basophils Absolute: 110 {cells}/uL (ref 0–200)
Basophils Relative: 1.6 %
Eosinophils Absolute: 173 {cells}/uL (ref 15–500)
Eosinophils Relative: 2.5 %
HCT: 35.7 % (ref 35.0–45.0)
Hemoglobin: 12.5 g/dL (ref 11.7–15.5)
MCH: 36 pg — ABNORMAL HIGH (ref 27.0–33.0)
MCHC: 35 g/dL (ref 32.0–36.0)
MCV: 102.9 fL — ABNORMAL HIGH (ref 80.0–100.0)
MPV: 10 fL (ref 7.5–12.5)
Monocytes Relative: 6.8 %
Neutro Abs: 4050 {cells}/uL (ref 1500–7800)
Neutrophils Relative %: 58.7 %
Platelets: 262 Thousand/uL (ref 140–400)
RBC: 3.47 Million/uL — ABNORMAL LOW (ref 3.80–5.10)
RDW: 12.1 % (ref 11.0–15.0)
Total Lymphocyte: 30.4 %
WBC: 6.9 Thousand/uL (ref 3.8–10.8)

## 2024-01-31 LAB — HEMOGLOBIN A1C
Hgb A1c MFr Bld: 5.3 % (ref <5.7)
Mean Plasma Glucose: 105 mg/dL
eAG (mmol/L): 5.8 mmol/L

## 2024-01-31 LAB — TSH: TSH: 2.4 m[IU]/L (ref 0.40–4.50)

## 2024-01-31 LAB — COMPREHENSIVE METABOLIC PANEL WITH GFR
AG Ratio: 1.6 (calc) (ref 1.0–2.5)
ALT: 13 U/L (ref 6–29)
AST: 17 U/L (ref 10–35)
Albumin: 4.2 g/dL (ref 3.6–5.1)
Alkaline phosphatase (APISO): 60 U/L (ref 37–153)
BUN: 9 mg/dL (ref 7–25)
CO2: 24 mmol/L (ref 20–32)
Calcium: 9.2 mg/dL (ref 8.6–10.4)
Chloride: 94 mmol/L — ABNORMAL LOW (ref 98–110)
Creat: 0.75 mg/dL (ref 0.50–1.05)
Globulin: 2.6 g/dL (ref 1.9–3.7)
Glucose, Bld: 84 mg/dL (ref 65–99)
Potassium: 4.8 mmol/L (ref 3.5–5.3)
Sodium: 126 mmol/L — ABNORMAL LOW (ref 135–146)
Total Bilirubin: 0.5 mg/dL (ref 0.2–1.2)
Total Protein: 6.8 g/dL (ref 6.1–8.1)
eGFR: 89 mL/min/1.73m2 (ref >=60)

## 2024-01-31 LAB — VITAMIN D 25 HYDROXY (VIT D DEFICIENCY, FRACTURES): Vit D, 25-Hydroxy: 53 ng/mL (ref 30–100)

## 2024-02-16 ENCOUNTER — Other Ambulatory Visit: Payer: Self-pay | Admitting: Family Medicine

## 2024-02-16 ENCOUNTER — Ambulatory Visit (INDEPENDENT_AMBULATORY_CARE_PROVIDER_SITE_OTHER): Payer: Self-pay | Admitting: Family Medicine

## 2024-02-16 ENCOUNTER — Encounter: Payer: Self-pay | Admitting: Family Medicine

## 2024-02-16 VITALS — BP 132/80 | HR 74 | Ht 59.84 in | Wt 125.0 lb

## 2024-02-16 DIAGNOSIS — I10 Essential (primary) hypertension: Secondary | ICD-10-CM

## 2024-02-16 DIAGNOSIS — L4050 Arthropathic psoriasis, unspecified: Secondary | ICD-10-CM

## 2024-02-16 DIAGNOSIS — E871 Hypo-osmolality and hyponatremia: Secondary | ICD-10-CM | POA: Diagnosis not present

## 2024-02-16 DIAGNOSIS — Z1231 Encounter for screening mammogram for malignant neoplasm of breast: Secondary | ICD-10-CM | POA: Diagnosis not present

## 2024-02-16 DIAGNOSIS — E559 Vitamin D deficiency, unspecified: Secondary | ICD-10-CM

## 2024-02-16 DIAGNOSIS — G629 Polyneuropathy, unspecified: Secondary | ICD-10-CM

## 2024-02-16 DIAGNOSIS — E782 Mixed hyperlipidemia: Secondary | ICD-10-CM

## 2024-02-16 DIAGNOSIS — R7309 Other abnormal glucose: Secondary | ICD-10-CM

## 2024-02-16 DIAGNOSIS — Z0000 Encounter for general adult medical examination without abnormal findings: Secondary | ICD-10-CM

## 2024-02-16 DIAGNOSIS — J9801 Acute bronchospasm: Secondary | ICD-10-CM

## 2024-02-16 DIAGNOSIS — M810 Age-related osteoporosis without current pathological fracture: Secondary | ICD-10-CM

## 2024-02-16 MED ORDER — ALBUTEROL SULFATE HFA 108 (90 BASE) MCG/ACT IN AERS: 2.0000 | INHALATION_SPRAY | Freq: Four times a day (QID) | RESPIRATORY_TRACT | 0 refills | Status: AC | PRN

## 2024-02-16 MED ORDER — ROSUVASTATIN CALCIUM 10 MG PO TABS: 10.0000 mg | ORAL_TABLET | ORAL | 3 refills | Status: AC

## 2024-02-16 NOTE — Progress Notes (Signed)
 Subjective:    Patient ID: Jane Sanchez, female    DOB: Sep 11, 1960, 63 y.o.   MRN: [id number]  Jane Sanchez is a 63 y.o. female presenting on 02/16/2024 for Annual Exam   HPI  Discussed the use of AI scribe software for clinical note transcription with the patient, who gave verbal consent to proceed.  History of Present Illness   Jane Sanchez is a 63 year old female who presents for an annual physical exam.   CHRONIC HTN: Hyponatremia History of chronic low sodium for years, no prior diagnosis. Suspected SIADH Last lab 126, avg 126-130 Asymptomatic She consumes a significant amount of water daily, approximately 40-64 ounces or more. Home BP readings 110-130 / 70-80 Current Meds - Losartan  50mg  daily, Metoprolol  XL 25mg  daily  needs 90 day orders Reports good compliance, took meds today. Tolerating well, w/o complaints. Denies CP, dyspnea, HA, edema, dizziness / lightheadedness   HYPERLIPIDEMIA: - Reports no concerns. Last lipid panel 01/2024, LDL 79 - Currently taking Rosuvastatin  10mg  3 x weekly, tolerating well without side effects or myalgias Fam hx   Allergy induced Asthma Scents and smells perfumes trigger. Improving control asthma On Albuterol  PRN   Chronic Back Pain W Sciatica Left sided only Osteoarthritis lumbar spine Admits muscle spasm and cramps. Already taking Gabapentin  300mg  TID History of compression fracture x 3 in back. Back pain sciatica keeping her awake    Psoriatic Arthritis / Osteoporosis Followed by [facility] On medication management for psoriasis   Vitamin D  Deficiency Resolved on lab Continue on Vitamin D3 supplement   Psoriasis [facility]soriasis - topical clobetasol   Health Maintenance:  Completed updated vaccines for 2025, including Flu, Prevnar-20 and COVID Booster at pharmacy  Due Mammogram, she will call to schedule, [place] location  Colonoscopy next due 2025, she prefers to  defer until 04/2024 after her vacation, she will need new referral Last done [facility] through [facility]. Repeat 3 years 2025     02/16/2024    1:49 PM 11/03/2023   10:36 AM 01/31/2023   11:22 AM  Depression screen PHQ 2/9  Decreased Interest 0 0 0  Down, Depressed, Hopeless 0 0 0  PHQ - 2 Score 0 0 0  Altered sleeping 0 0 0  Tired, decreased energy 0 0 1  Change in appetite 0 0 0  Feeling bad or failure about yourself  0 0 0  Trouble concentrating 0 0 0  Moving slowly or fidgety/restless 0 0 0  Suicidal thoughts 0 0 0  PHQ-9 Score 0 0 1  Difficult doing work/chores Not difficult at all Not difficult at all        02/16/2024    1:49 PM 11/03/2023   10:36 AM 01/31/2023   11:22 AM 07/12/2022   11:01 AM  GAD 7 : Generalized Anxiety Score  Nervous, Anxious, on Edge 0 0 0 0  Control/stop worrying 0 0 0 0  Worry too much - different things 0 0 0 0  Trouble relaxing 0 0 0 0  Restless 0 0 0 0  Easily annoyed or irritable 0 0 0 0  Afraid - awful might happen 0 0 0 0  Total GAD 7 Score 0 0 0 0  Anxiety Difficulty Not difficult at all Not difficult at all  Not difficult at all     Past Medical History:  Diagnosis Date   Arthritis    Asthma    Hyperlipidemia    Hypertension    Osteoporosis  Past Surgical History:  Procedure Laterality Date   BREAST BIOPSY Bilateral    benign cysts   BREAST EXCISIONAL BIOPSY Left    BREAST EXCISIONAL BIOPSY Right    CESAREAN SECTION  1986   1989, 1991   KNEE SURGERY Left 1983   MENISCECTOMY Right 2011   TOTAL ABDOMINAL HYSTERECTOMY  2001   removal of cervix as well   Social History   Socioeconomic History   Marital status: Divorced    Spouse name: Not on file   Number of children: Not on file   Years of education: Not on file   Highest education level: Not on file  Occupational History   Not on file  Tobacco Use   Smoking status: Never   Smokeless tobacco: Never  Vaping Use   Vaping status: Never Used  Substance and  Sexual Activity   Alcohol use: Yes    Alcohol/week: 2.0 standard drinks of alcohol    Types: 2 Glasses of wine per week   Drug use: Never   Sexual activity: Not on file  Other Topics Concern   Not on file  Social History Narrative   Not on file   Social Drivers of Health   Financial Resource Strain: Not on file  Food Insecurity: Not on file  Transportation Needs: Not on file  Physical Activity: Not on file  Stress: Not on file  Social Connections: Unknown (09/08/2021)   [facility]   Social Network    Social Network: Not on file  Intimate Partner Violence: Unknown (07/31/2021)   Received from [facility]   HITS    Physically Hurt: Not on file    Insult or Talk Down To: Not on file    Threaten Physical Harm: Not on file    Scream or Curse: Not on file   Family History  Problem Relation Age of Onset   Breast cancer Mother    Bladder Cancer Mother    Breast cancer Maternal Aunt    Current Outpatient Medications on File Prior to Visit  Medication Sig   clobetasol ointment (TEMOVATE) 0.05 % Apply 1 Application topically 2 (two) times daily.   cyclobenzaprine  (FLEXERIL ) 10 MG tablet Take 1 tablet (10 mg total) by mouth at bedtime.   denosumab (PROLIA) 60 MG/ML SOSY injection Inject 60 mg into the skin every 6 (six) months.   EPINEPHrine 0.3 mg/0.3 mL IJ SOAJ injection    gabapentin  (NEURONTIN ) 300 MG capsule TAKE 1 CAPSULE BY MOUTH 3 TIMES DAILY   HYRIMOZ 40 MG/0.4ML SOAJ    metoprolol  succinate (TOPROL -XL) 25 MG 24 hr tablet Take 1 tablet (25 mg total) by mouth daily.   olmesartan  (BENICAR ) 40 MG tablet Take 1 tablet (40 mg total) by mouth daily.   valACYclovir  (VALTREX ) 500 MG tablet Take 1 tablet (500 mg total) by mouth daily.   CIMZIA 2 X 200 MG/ML PSKT Inject into the skin. (Patient not taking: Reported on 02/16/2024)   VEVYE 0.1 % SOLN Apply 1 drop to eye 2 (two) times daily. (Patient not taking: Reported on 02/16/2024)   No current  facility-administered medications on file prior to visit.    Review of Systems  Constitutional:  Negative for activity change, appetite change, chills, diaphoresis, fatigue and fever.  HENT:  Negative for congestion and hearing loss.   Eyes:  Negative for visual disturbance.  Respiratory:  Negative for cough, chest tightness, shortness of breath and wheezing.   Cardiovascular:  Negative for chest pain, palpitations and leg  swelling.  Gastrointestinal:  Negative for abdominal pain, constipation, diarrhea, nausea and vomiting.  Genitourinary:  Negative for dysuria, frequency and hematuria.  Musculoskeletal:  Negative for arthralgias and neck pain.  Skin:  Negative for rash.  Neurological:  Negative for dizziness, weakness, light-headedness, numbness and headaches.  Hematological:  Negative for adenopathy.  Psychiatric/Behavioral:  Negative for behavioral problems, dysphoric mood and sleep disturbance.    Per HPI unless specifically indicated above     Objective:    BP 132/80 (BP Location: Left Arm, Patient Position: Sitting, Cuff Size: Normal)   Pulse 74   Ht 4' 11.84 (1.52 m)   Wt 125 lb (56.7 kg)   SpO2 99%   BMI 24.54 kg/m   Wt Readings from Last 3 Encounters:  02/16/24 125 lb (56.7 kg)  11/03/23 126 lb 12.8 oz (57.5 kg)  01/31/23 124 lb (56.2 kg)    Physical Exam Vitals and nursing note reviewed.  Constitutional:      General: She is not in acute distress.    Appearance: She is well-developed. She is not diaphoretic.     Comments: Well-appearing, comfortable, cooperative  HENT:     Head: Normocephalic and atraumatic.  Eyes:     General:        Right eye: No discharge.        Left eye: No discharge.     Conjunctiva/sclera: Conjunctivae normal.     Pupils: Pupils are equal, round, and reactive to light.  Neck:     Thyroid: No thyromegaly.     Vascular: No carotid bruit.  Cardiovascular:     Rate and Rhythm: Normal rate and regular rhythm.     Pulses: Normal  pulses.     Heart sounds: Normal heart sounds. No murmur heard. Pulmonary:     Effort: Pulmonary effort is normal. No respiratory distress.     Breath sounds: Normal breath sounds. No wheezing or rales.  Abdominal:     General: Bowel sounds are normal. There is no distension.     Palpations: Abdomen is soft. There is no mass.     Tenderness: There is no abdominal tenderness.  Musculoskeletal:        General: No tenderness. Normal range of motion.     Cervical back: Normal range of motion and neck supple.     Right lower leg: No edema.     Left lower leg: No edema.     Comments: Upper / Lower Extremities: - Normal muscle tone, strength bilateral upper extremities 5/5, lower extremities 5/5  Lymphadenopathy:     Cervical: No cervical adenopathy.  Skin:    General: Skin is warm and dry.     Findings: No erythema or rash.  Neurological:     Mental Status: She is alert and oriented to person, place, and time.     Comments: Distal sensation intact to light touch all extremities  Psychiatric:        Mood and Affect: Mood normal.        Behavior: Behavior normal.        Thought Content: Thought content normal.     Comments: Well groomed, good eye contact, normal speech and thoughts     Results for orders placed or performed in visit on 02/16/24  HM COLONOSCOPY   Collection Time: 06/23/20 12:00 AM  Result Value Ref Range   HM Colonoscopy See Report (in chart) See Report (in chart), Patient Reported      Assessment & Plan:   Problem List  Items Addressed This Visit     Essential hypertension   Relevant Medications   rosuvastatin  (CRESTOR ) 10 MG tablet   Mixed hyperlipidemia   Relevant Medications   rosuvastatin  (CRESTOR ) 10 MG tablet   Neuropathy   Psoriatic arthritis (HCC)   Relevant Medications   HYRIMOZ 40 MG/0.4ML SOAJ   Other Visit Diagnoses       Annual physical exam    -  Primary     Encounter for screening mammogram for malignant neoplasm of breast        Relevant Orders   MM 3D SCREENING MAMMOGRAM BILATERAL BREAST     Chronic hyponatremia         Bronchospasm       Relevant Medications   albuterol  (VENTOLIN  HFA) 108 (90 Base) MCG/ACT inhaler     Vitamin D  deficiency            Updated Health Maintenance information Reviewed recent lab results with patient Encouraged improvement to lifestyle with diet and exercise Goal of weight loss  Adult Wellness Visit Annual wellness visit conducted. Reviewed vaccinations, cancer screenings, and health maintenance. Discussed optional heart scan. - Order mammogram at [facility]. She will call to schedule - Provide heart scan information for review. CT Coronary Scan - Schedule follow-up in one year.  Chronic hyponatremia Chronic hyponatremia with sodium levels in 120s-130s. No significant kidney dysfunction. Possible SIADH. Stable condition. - Present for >10-15+ years - Reviewed prior labs Urine Osm 246 and Urine Sodium 64, check Serum Osm next time - Monitor sodium levels. - Recommend relative fluid restriction  Hyperlipidemia Hyperlipidemia controlled with rosuvastatin  10 mg three times a week. Cholesterol levels stable. - Continue rosuvastatin  10 mg three times a week. - Refill rosuvastatin  prescription.  Asthma intermittent only Seasonal Allergic trigger Asthma managed with albuterol  inhaler as needed. Avoids known triggers. - Refill albuterol  inhaler AS NEEDED use  Psoriatic arthritis Followed by [facility], on medication management Psoriatic arthritis with dry skin and itching. Discussed non-prescription options for management. Continue rx for Psoriatic Arthritis - Recommend Aveeno for daily moisturization. - Use prescription ointment for lesions as needed.  Anemia improved Stable anemia with hemoglobin levels 10-12. Current hemoglobin at 12.5. - Continue monitoring hemoglobin levels.  General Health Maintenance Vaccinations up to date. Discussed  colonoscopy and mammogram scheduling. Optional heart scan discussed.  - Schedule colonoscopy after January 2026. She will contact us  on mychart message to initiate this referral to [facility]     Orders Placed This Encounter  Procedures   MM 3D SCREENING MAMMOGRAM BILATERAL BREAST    Standing Status:   Future    Expiration Date:   02/15/2025    Reason for Exam (SYMPTOM  OR DIAGNOSIS REQUIRED):   Screening bilateral 3D Mammogram Tomo    Preferred imaging location?:   [facility] [place]   HM COLONOSCOPY    This external order was created through the Results Console.    Meds ordered this encounter  Medications   rosuvastatin  (CRESTOR ) 10 MG tablet    Sig: Take 1 tablet (10 mg total) by mouth 3 (three) times a week.    Dispense:  36 tablet    Refill:  3   albuterol  (VENTOLIN  HFA) 108 (90 Base) MCG/ACT inhaler    Sig: Inhale 2 puffs into the lungs every 6 (six) hours as needed for wheezing or shortness of breath.    Dispense:  8 g    Refill:  0     Follow up plan:  Return for 1 year fasting lab > 1 week later Annual Physical.  Future labs 02/14/25  Marsa Officer, DO [facility] 02/16/2024, 1:22 PM

## 2024-02-16 NOTE — Patient Instructions (Addendum)
 Thank you for coming to the office today.  Labs look good overall  Chronic Low Sodium Keep trying to limit some water intake 40-60 oz or less  Refill Rosuvastatin   Please message or call in January after your [place] Trip and we can order a referral for GI Colonoscopy at [facility].   [facility]) -  [address] - [address] [place], [place] [address] Phone: [phone]  --------------  For Mammogram screening for breast cancer   Call the Imaging Center below anytime to schedule your own appointment now that order has been placed.  [facility] [address] [place], [place] [address] Phone: [phone]   [email or web address]   FUTURE CONSIDERATION  Coronary Calcium  Score Cardiac CT Scan. This is a screening test for patients aged 48-50+ with cardiovascular risk factors or who are healthy but would be interested in Cardiovascular Screening for heart disease. Even if there is a family history of heart disease, this imaging can be useful. Typically it can be done every 5+ years or at a different timeline we agree on  The scan will look at the chest and mainly focus on the heart and identify early signs of calcium  build up or blockages within the heart arteries. It is not 100% accurate for identifying blockages or heart disease, but it is useful to help us  predict who may have some early changes or be at risk in the future for a heart attack or cardiovascular problem.  The results are reviewed by a Cardiologist and they will document the results. It should become available on MyChart. Typically the results are divided into percentiles based on other patients of the same demographic and age. So it will compare your risk to others similar to you. If you have a higher score >99 or higher percentile >75%tile, it is recommended to consider Statin cholesterol  therapy and or referral to Cardiologist. I will try to help explain your results and if we have questions we can contact the Cardiologist.  You will be contacted for scheduling. Usually it is done at any imaging facility through [facility], [facility] or [facility].  The cost is $99 flat fee total and it does not go through insurance, so no authorization is required.  DUE for FASTING BLOOD WORK (no food or drink after midnight before the lab appointment, only water or coffee without cream/sugar on the morning of)  SCHEDULE Lab Only visit in the morning at the clinic for lab draw in 1 YEAR  - Make sure Lab Only appointment is at about 1 week before your next appointment, so that results will be available  For Lab Results, once available within 2-3 days of blood draw, you can can log in to MyChart online to view your results and a brief explanation. Also, we can discuss results at next follow-up visit.   Please schedule a Follow-up Appointment to: Return for 1 year fasting lab > 1 week later Annual Physical.  If you have any other questions or concerns, please feel free to call the office or send a message through MyChart. You may also schedule an earlier appointment if necessary.  Additionally, you may be receiving a survey about your experience at our office within a few days to 1 week by e-mail or mail. We value your feedback.  Marsa Officer, DO [facility], [place]

## 2024-04-15 ENCOUNTER — Ambulatory Visit: Admission: RE | Admit: 2024-04-15 | Discharge: 2024-04-15 | Attending: Family Medicine | Admitting: Family Medicine

## 2024-04-15 DIAGNOSIS — Z1231 Encounter for screening mammogram for malignant neoplasm of breast: Secondary | ICD-10-CM | POA: Insufficient documentation

## 2024-04-26 ENCOUNTER — Encounter: Payer: Self-pay | Admitting: Family Medicine

## 2024-04-26 DIAGNOSIS — L4050 Arthropathic psoriasis, unspecified: Secondary | ICD-10-CM | POA: Diagnosis not present

## 2024-04-26 MED ORDER — PREDNISONE 10 MG PO TABS: ORAL_TABLET | ORAL | 0 refills | Status: AC

## 2024-04-26 NOTE — Telephone Encounter (Signed)
 Please see the MyChart message reply(ies) for my assessment and plan.    This patient gave consent for this Medical Advice Message and is aware that it may result in a bill to [employer], as well as the possibility of receiving a bill for a co-payment or deductible. They are an established patient, but are not seeking medical advice exclusively about a problem treated during an in person or video visit in the last seven days. I did not recommend an in person or video visit within seven days of my reply.    I spent a total of 7 minutes cumulative time within 7 days through [employer].  Saralyn Pilar, DO

## 2025-02-14 ENCOUNTER — Other Ambulatory Visit

## 2025-02-18 ENCOUNTER — Encounter: Admitting: Family Medicine
